# Patient Record
Sex: Male | Born: 1935 | Race: White | Hispanic: No | Marital: Married | Smoking: Former smoker
Health system: Southern US, Community
[De-identification: ages and names within clinical notes are randomized; demographics above are authoritative.]

## PROBLEM LIST (undated history)

## (undated) DIAGNOSIS — I639 Cerebral infarction, unspecified: Secondary | ICD-10-CM

## (undated) DIAGNOSIS — E785 Hyperlipidemia, unspecified: Secondary | ICD-10-CM

## (undated) DIAGNOSIS — R42 Dizziness and giddiness: Secondary | ICD-10-CM

## (undated) DIAGNOSIS — F0390 Unspecified dementia without behavioral disturbance: Secondary | ICD-10-CM

## (undated) DIAGNOSIS — I259 Chronic ischemic heart disease, unspecified: Secondary | ICD-10-CM

## (undated) DIAGNOSIS — I493 Ventricular premature depolarization: Secondary | ICD-10-CM

## (undated) DIAGNOSIS — I509 Heart failure, unspecified: Secondary | ICD-10-CM

## (undated) DIAGNOSIS — R001 Bradycardia, unspecified: Secondary | ICD-10-CM

## (undated) DIAGNOSIS — R1013 Epigastric pain: Secondary | ICD-10-CM

## (undated) DIAGNOSIS — I252 Old myocardial infarction: Secondary | ICD-10-CM

## (undated) DIAGNOSIS — I440 Atrioventricular block, first degree: Secondary | ICD-10-CM

## (undated) HISTORY — DX: Hyperlipidemia, unspecified: E78.5

## (undated) HISTORY — DX: Epigastric pain: R10.13

## (undated) HISTORY — DX: Cerebral infarction, unspecified: I63.9

## (undated) HISTORY — DX: Ventricular premature depolarization: I49.3

## (undated) HISTORY — DX: Chronic ischemic heart disease, unspecified: I25.9

## (undated) HISTORY — PX: VASECTOMY: SHX75

## (undated) HISTORY — DX: Unspecified dementia, unspecified severity, without behavioral disturbance, psychotic disturbance, mood disturbance, and anxiety: F03.90

## (undated) HISTORY — DX: Heart failure, unspecified: I50.9

## (undated) HISTORY — DX: Old myocardial infarction: I25.2

## (undated) HISTORY — DX: Dizziness and giddiness: R42

## (undated) HISTORY — DX: Bradycardia, unspecified: R00.1

## (undated) HISTORY — DX: Atrioventricular block, first degree: I44.0

---

## 1997-10-08 ENCOUNTER — Inpatient Hospital Stay (HOSPITAL_COMMUNITY): Admission: EM | Admit: 1997-10-08 | Discharge: 1997-10-09 | Payer: Self-pay | Admitting: Emergency Medicine

## 1999-12-05 ENCOUNTER — Encounter (INDEPENDENT_AMBULATORY_CARE_PROVIDER_SITE_OTHER): Payer: Self-pay

## 1999-12-05 ENCOUNTER — Ambulatory Visit (HOSPITAL_COMMUNITY): Admission: RE | Admit: 1999-12-05 | Discharge: 1999-12-05 | Payer: Self-pay | Admitting: Gastroenterology

## 2001-11-19 ENCOUNTER — Encounter: Payer: Self-pay | Admitting: Emergency Medicine

## 2001-11-19 ENCOUNTER — Inpatient Hospital Stay (HOSPITAL_COMMUNITY): Admission: EM | Admit: 2001-11-19 | Discharge: 2001-11-21 | Payer: Self-pay | Admitting: Emergency Medicine

## 2001-11-20 ENCOUNTER — Encounter: Payer: Self-pay | Admitting: Cardiology

## 2001-11-20 ENCOUNTER — Encounter: Payer: Self-pay | Admitting: Pediatrics

## 2002-11-14 ENCOUNTER — Ambulatory Visit (HOSPITAL_COMMUNITY): Admission: RE | Admit: 2002-11-14 | Discharge: 2002-11-14 | Payer: Self-pay | Admitting: Gastroenterology

## 2002-11-14 ENCOUNTER — Encounter (INDEPENDENT_AMBULATORY_CARE_PROVIDER_SITE_OTHER): Payer: Self-pay | Admitting: Specialist

## 2003-09-12 ENCOUNTER — Encounter
Admission: RE | Admit: 2003-09-12 | Discharge: 2003-09-12 | Payer: Self-pay | Admitting: Physical Medicine and Rehabilitation

## 2004-11-08 ENCOUNTER — Emergency Department (HOSPITAL_COMMUNITY): Admission: EM | Admit: 2004-11-08 | Discharge: 2004-11-08 | Payer: Self-pay | Admitting: Emergency Medicine

## 2004-11-14 ENCOUNTER — Encounter: Admission: RE | Admit: 2004-11-14 | Discharge: 2004-11-14 | Payer: Self-pay | Admitting: Urology

## 2004-11-17 ENCOUNTER — Ambulatory Visit (HOSPITAL_BASED_OUTPATIENT_CLINIC_OR_DEPARTMENT_OTHER): Admission: RE | Admit: 2004-11-17 | Discharge: 2004-11-17 | Payer: Self-pay | Admitting: Urology

## 2004-11-17 ENCOUNTER — Ambulatory Visit (HOSPITAL_COMMUNITY): Admission: RE | Admit: 2004-11-17 | Discharge: 2004-11-17 | Payer: Self-pay | Admitting: Urology

## 2004-11-17 ENCOUNTER — Encounter (INDEPENDENT_AMBULATORY_CARE_PROVIDER_SITE_OTHER): Payer: Self-pay | Admitting: *Deleted

## 2005-03-01 ENCOUNTER — Ambulatory Visit (HOSPITAL_BASED_OUTPATIENT_CLINIC_OR_DEPARTMENT_OTHER): Admission: RE | Admit: 2005-03-01 | Discharge: 2005-03-01 | Payer: Self-pay | Admitting: Urology

## 2005-03-01 ENCOUNTER — Encounter (INDEPENDENT_AMBULATORY_CARE_PROVIDER_SITE_OTHER): Payer: Self-pay | Admitting: Specialist

## 2005-03-01 ENCOUNTER — Ambulatory Visit (HOSPITAL_COMMUNITY): Admission: RE | Admit: 2005-03-01 | Discharge: 2005-03-01 | Payer: Self-pay | Admitting: Urology

## 2005-06-14 ENCOUNTER — Ambulatory Visit (HOSPITAL_BASED_OUTPATIENT_CLINIC_OR_DEPARTMENT_OTHER): Admission: RE | Admit: 2005-06-14 | Discharge: 2005-06-14 | Payer: Self-pay | Admitting: Urology

## 2005-06-14 ENCOUNTER — Encounter (INDEPENDENT_AMBULATORY_CARE_PROVIDER_SITE_OTHER): Payer: Self-pay | Admitting: Specialist

## 2006-04-09 ENCOUNTER — Encounter: Admission: RE | Admit: 2006-04-09 | Discharge: 2006-04-09 | Payer: Self-pay | Admitting: Urology

## 2006-04-11 ENCOUNTER — Ambulatory Visit (HOSPITAL_BASED_OUTPATIENT_CLINIC_OR_DEPARTMENT_OTHER): Admission: RE | Admit: 2006-04-11 | Discharge: 2006-04-11 | Payer: Self-pay | Admitting: Urology

## 2006-04-11 ENCOUNTER — Encounter (INDEPENDENT_AMBULATORY_CARE_PROVIDER_SITE_OTHER): Payer: Self-pay | Admitting: Specialist

## 2006-08-28 ENCOUNTER — Encounter (INDEPENDENT_AMBULATORY_CARE_PROVIDER_SITE_OTHER): Payer: Self-pay | Admitting: *Deleted

## 2006-08-28 ENCOUNTER — Ambulatory Visit (HOSPITAL_BASED_OUTPATIENT_CLINIC_OR_DEPARTMENT_OTHER): Admission: RE | Admit: 2006-08-28 | Discharge: 2006-08-28 | Payer: Self-pay | Admitting: Urology

## 2007-01-25 ENCOUNTER — Encounter: Admission: RE | Admit: 2007-01-25 | Discharge: 2007-01-25 | Payer: Self-pay | Admitting: Cardiology

## 2007-04-15 ENCOUNTER — Encounter: Admission: RE | Admit: 2007-04-15 | Discharge: 2007-04-15 | Payer: Self-pay | Admitting: Urology

## 2007-04-17 ENCOUNTER — Encounter (INDEPENDENT_AMBULATORY_CARE_PROVIDER_SITE_OTHER): Payer: Self-pay | Admitting: Urology

## 2007-04-17 ENCOUNTER — Ambulatory Visit (HOSPITAL_BASED_OUTPATIENT_CLINIC_OR_DEPARTMENT_OTHER): Admission: RE | Admit: 2007-04-17 | Discharge: 2007-04-17 | Payer: Self-pay | Admitting: Urology

## 2008-06-29 ENCOUNTER — Inpatient Hospital Stay (HOSPITAL_COMMUNITY): Admission: AD | Admit: 2008-06-29 | Discharge: 2008-07-03 | Payer: Self-pay | Admitting: Cardiology

## 2008-12-09 IMAGING — CT CT HEAD W/O CM
1 series · 16 of 28 positions shown, 20 images · IV contrast (agent unspecified)
Comparison: none

CLINICAL DATA: Mental status changes.  Confusion.   History of strokes.  
 HEAD CT WITHOUT CONTRAST:
TECHNIQUE: Contiguous axial images were obtained from the base of the skull through the vertex according to standard protocol without contrast.

[Series 2: head · axial · 0.49mm/px · z∈[+14,+149]mm · 16 of 28 slices shown, 20 images]
[im 2/28  brain]
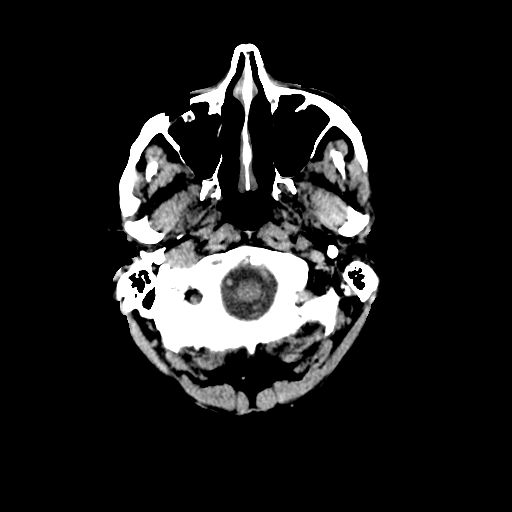
[im 2/28  bone]
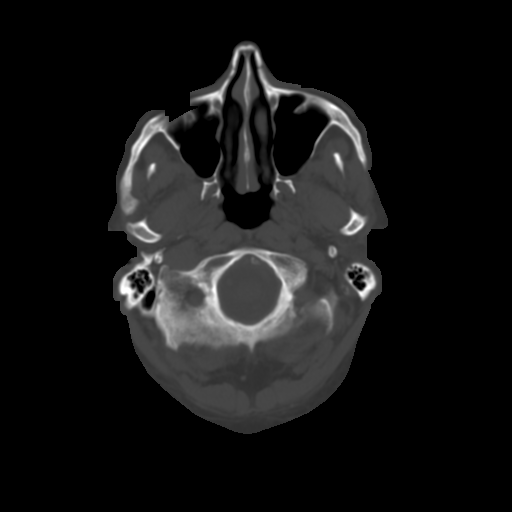
[im 4/28  brain]
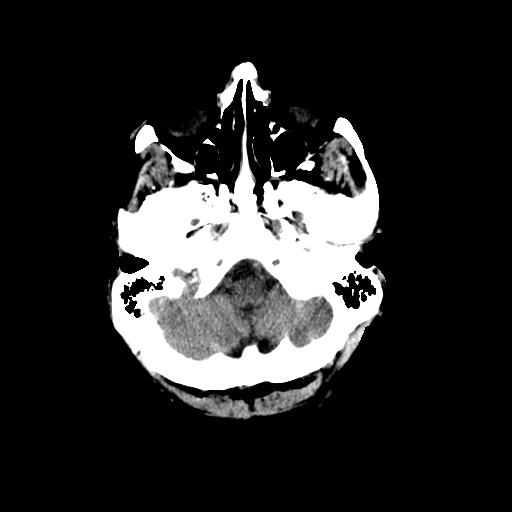
[im 6/28  brain]
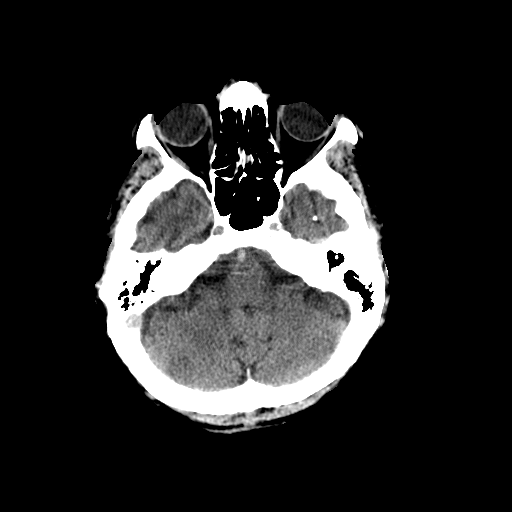
[im 7/28  brain]
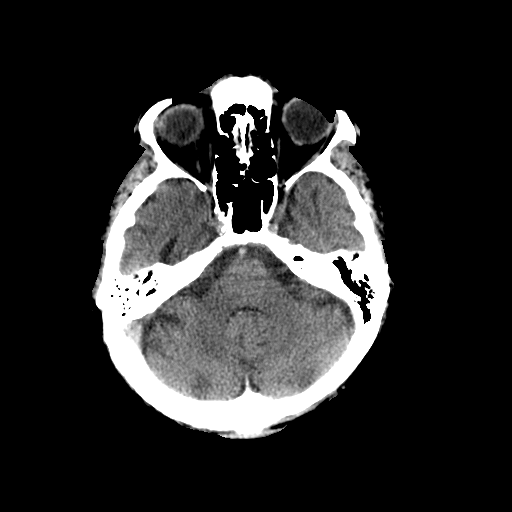
[im 9/28  brain]
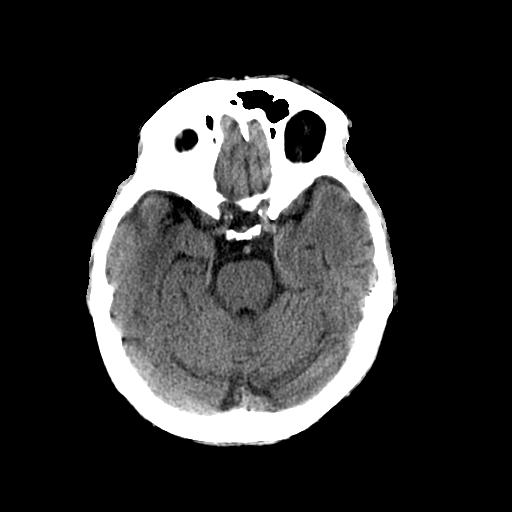
[im 9/28  bone]
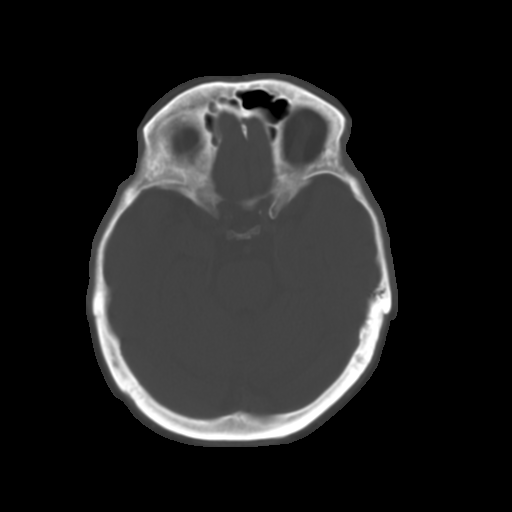
[im 10/28  brain]
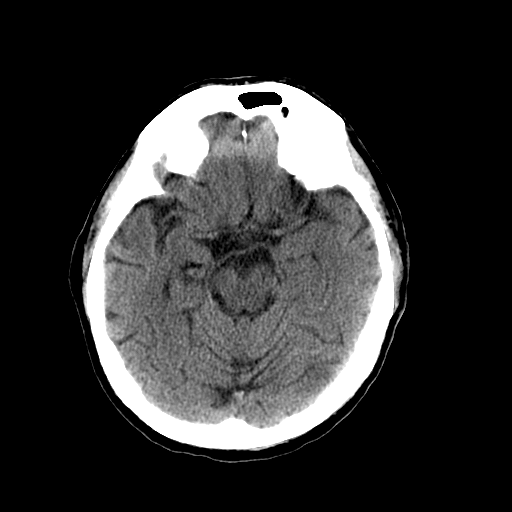
[im 12/28  brain]
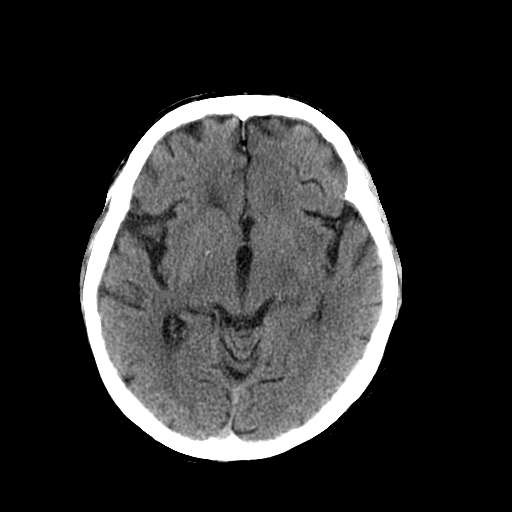
[im 14/28  brain]
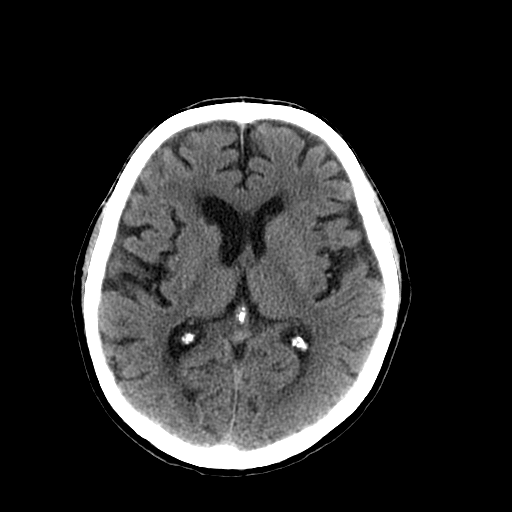
[im 15/28  brain]
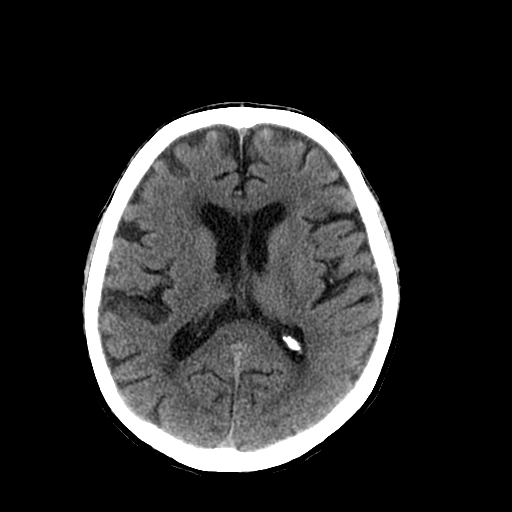
[im 15/28  bone]
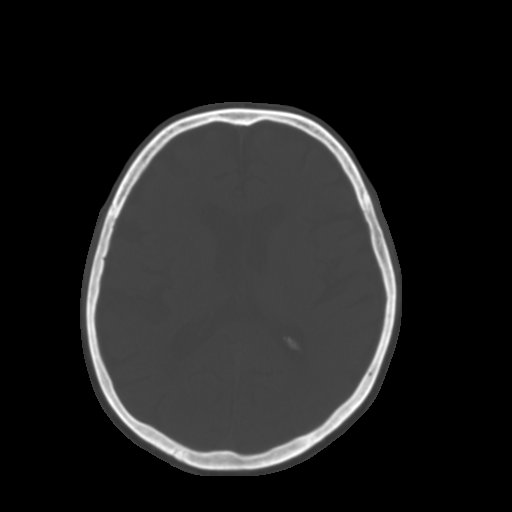
[im 17/28  brain]
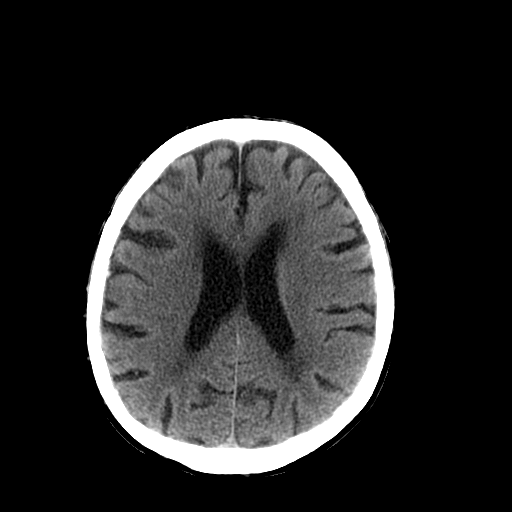
[im 19/28  brain]
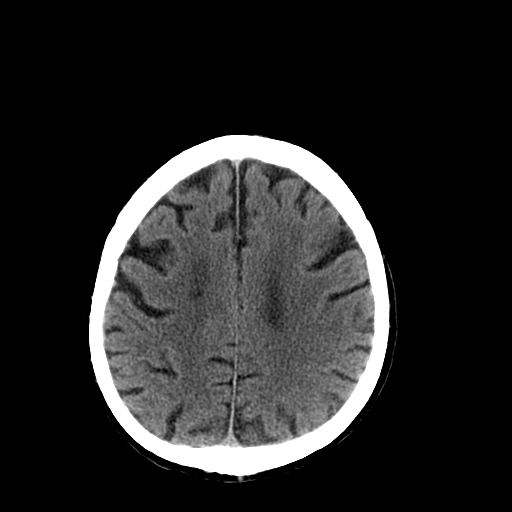
[im 20/28  brain]
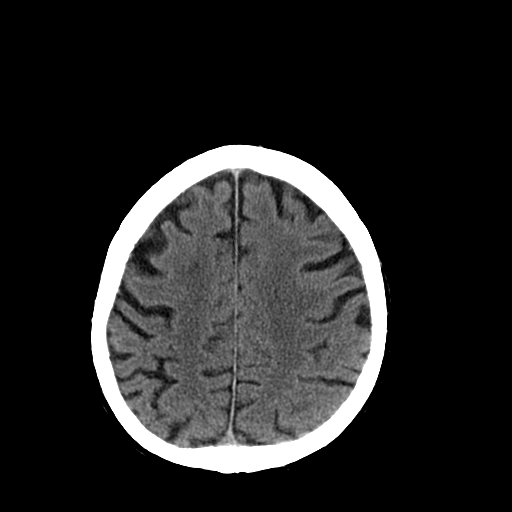
[im 22/28  brain]
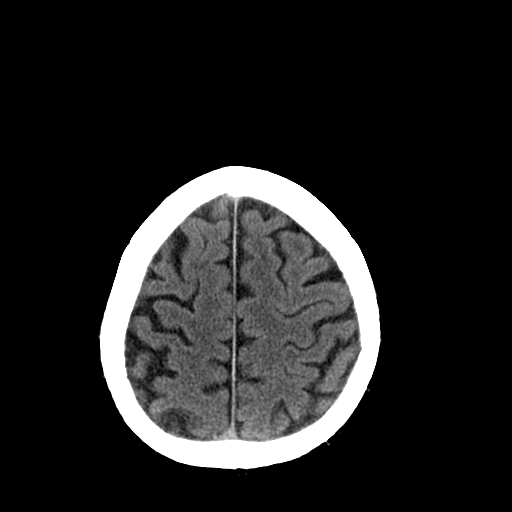
[im 22/28  bone]
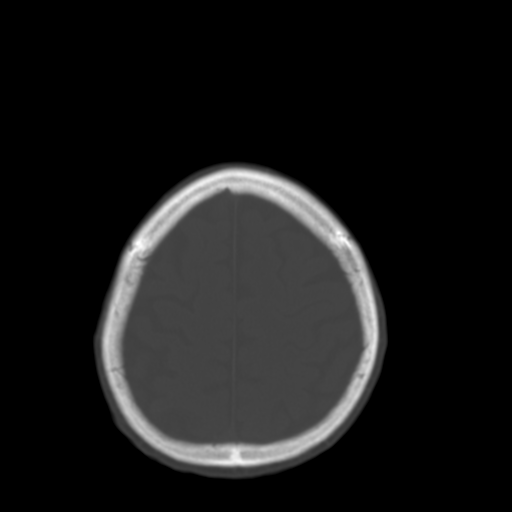
[im 23/28  brain]
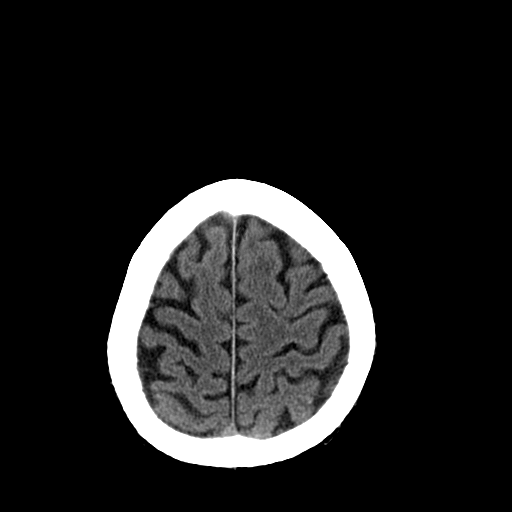
[im 25/28  brain]
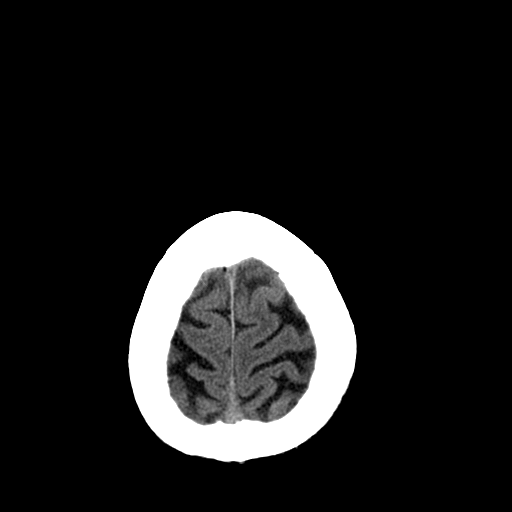
[im 27/28  brain]
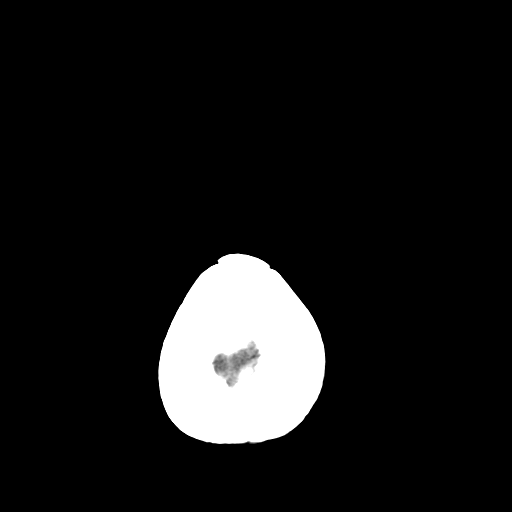

[16 of 28 positions shown; findings below may reference images not displayed]

FINDINGS: Age appropriate moderate generalized cerebral atrophic changes.  Mild cerebellar atrophy.  Findings compatible with chronic small vessel disease of the cerebral deep periventricular white matter.  Negative for acute infarction, hemorrhage, mass, or hydronephrosis.
IMPRESSION: Atrophic changes and chronic small vessel disease of the cerebral white matter.

## 2010-04-01 ENCOUNTER — Ambulatory Visit: Payer: Self-pay | Admitting: Cardiology

## 2010-05-14 IMAGING — CR DG CHEST 1V PORT
1 series · 1 of 1 positions shown · non-contrast
Comparison: Portable exam 4314 hours compared to 04/15/2007

CLINICAL DATA: CHF, weakness, hypotension, history coronary artery
disease status post bypass, former smoker

PORTABLE CHEST - 1 VIEW

[view not recorded]
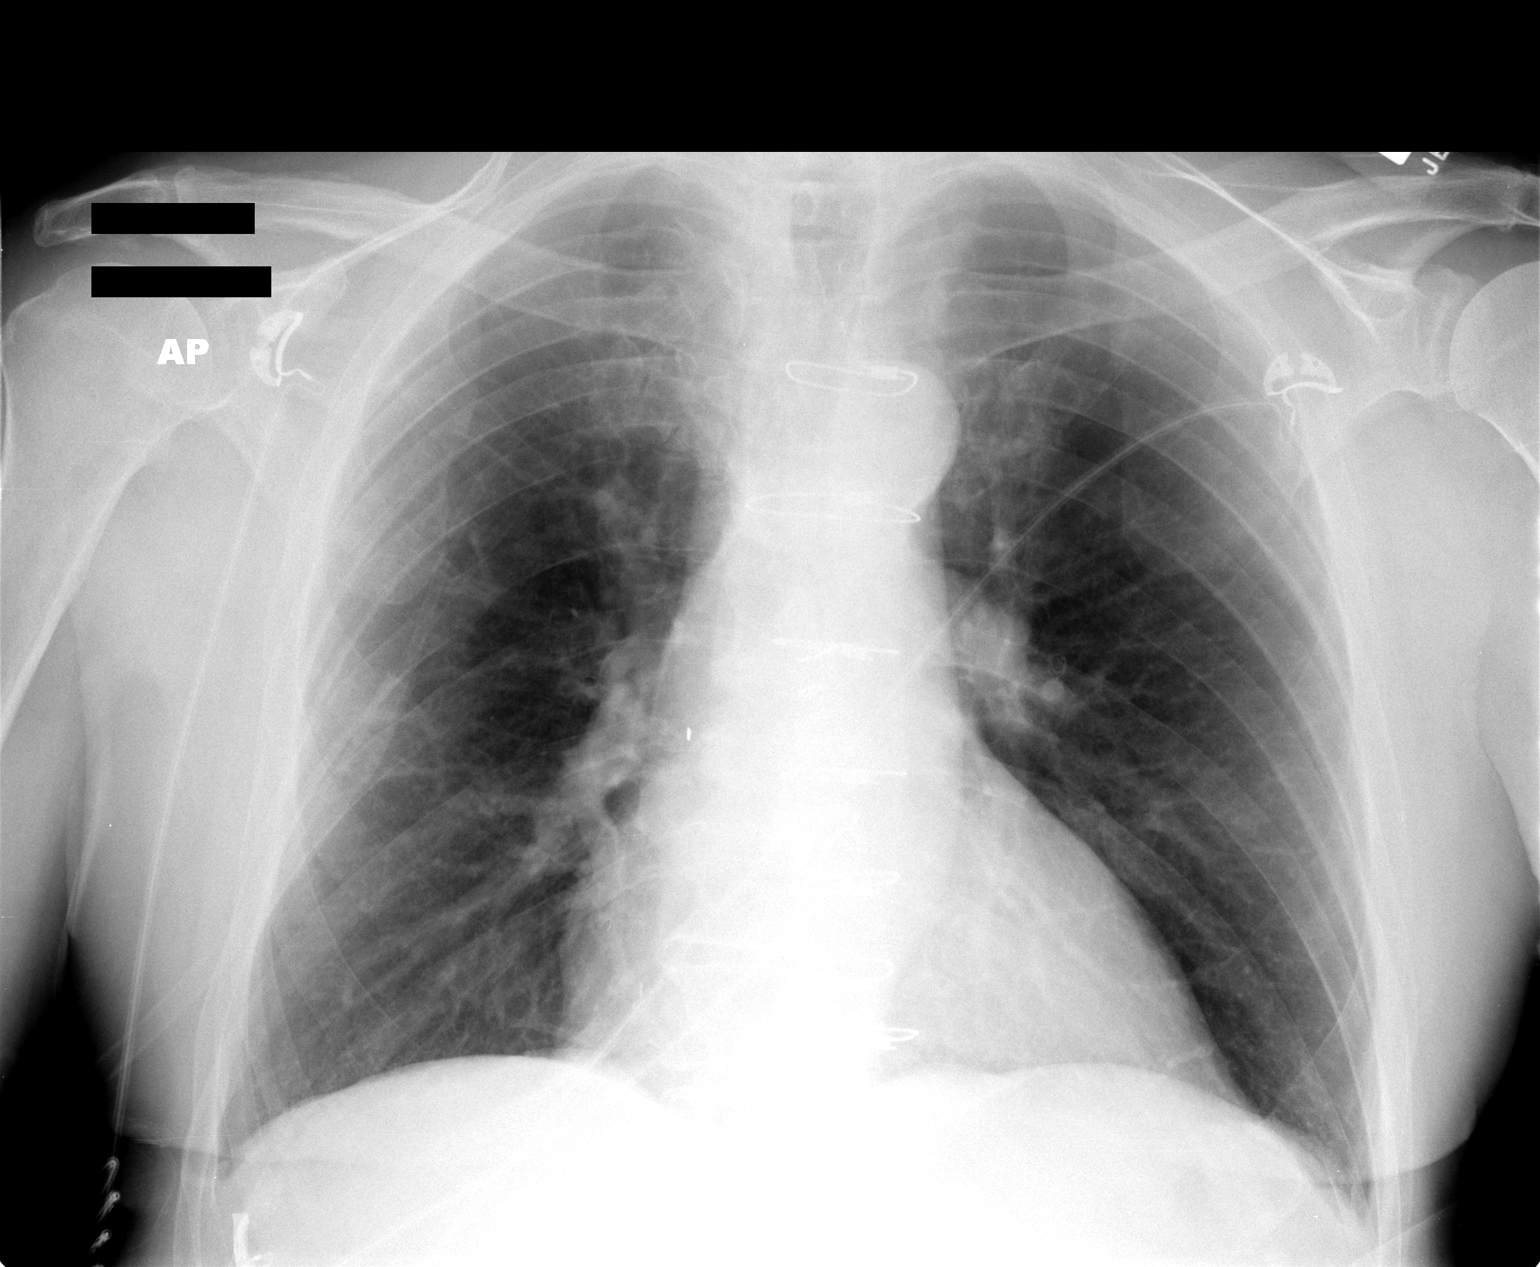

[1 of 1 positions shown; findings below may reference images not displayed]

FINDINGS: Mild cardiac enlargement status post CABG.
Tortuous thoracic aorta.
Pulmonary vascularity normal.
No acute infiltrate or effusion.
Bony demineralization.
IMPRESSION: Mild cardiomegaly status post CABG.
No acute abnormalities.

## 2010-05-15 IMAGING — US US RENAL PORT
1 series · 14 of 25 positions shown · non-contrast
Comparison: MRI lumbar spine 09/12/2003.

CLINICAL DATA: 83-year-old male with acute renal failure and
dehydration.

RENAL/URINARY TRACT ULTRASOUND
TECHNIQUE: Complete ultrasound examination of the urinary tract
was performed including evaluation of the kidneys, renal collecting
systems, and urinary bladder.

[Series 1: us renal port · 0.24mm/px · 30 acquisitions, 14 frames shown]
[im 1/30]
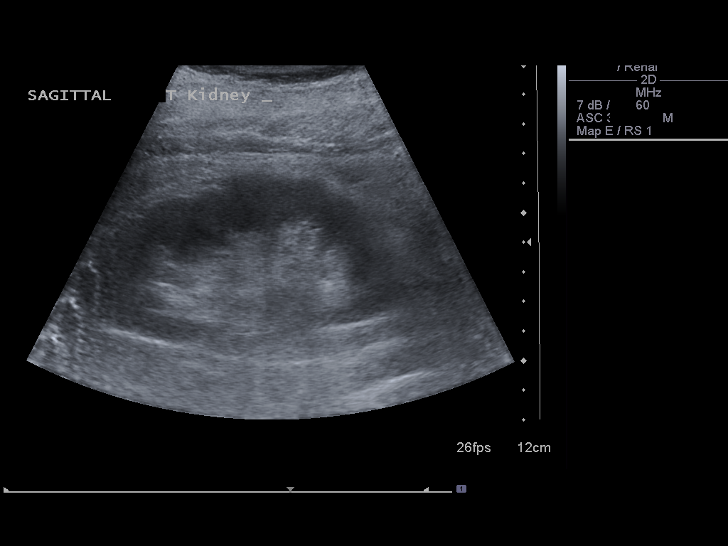
[im 3/30]
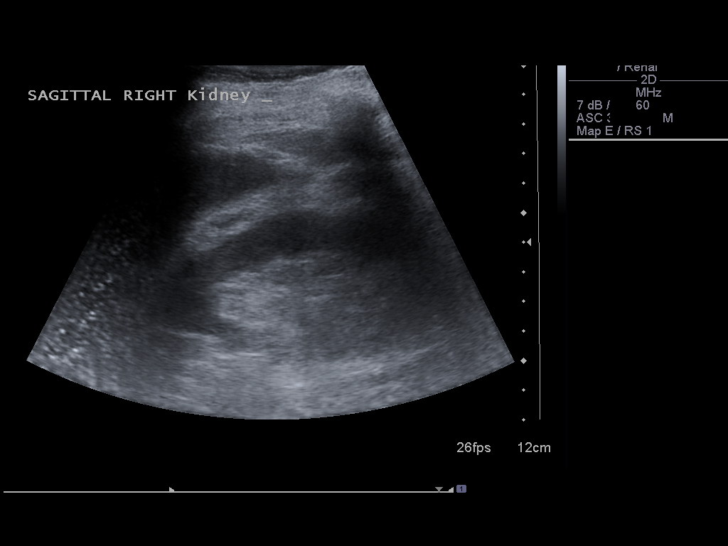
[im 5/30]
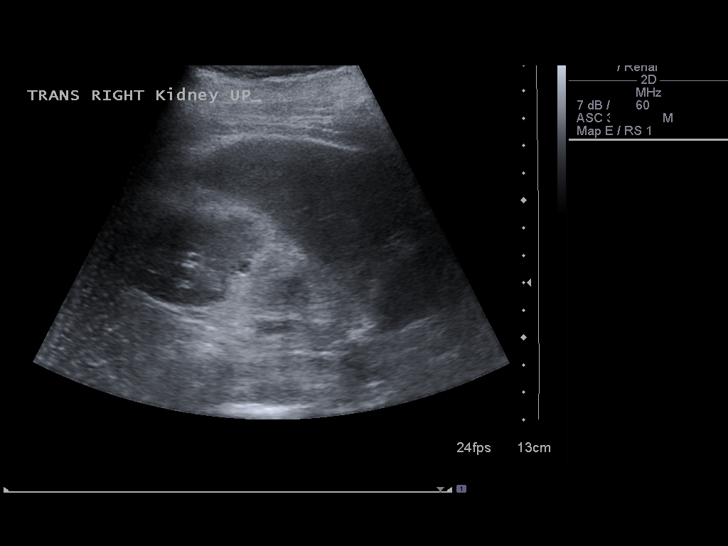
[im 8/30]
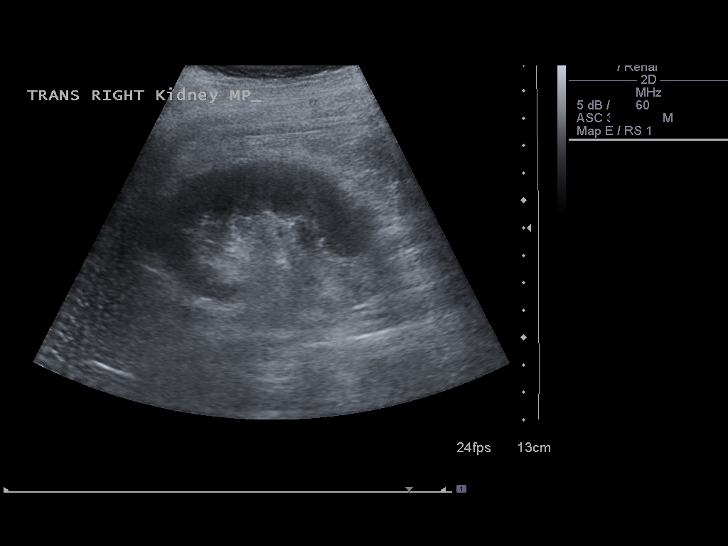
[im 10/30]
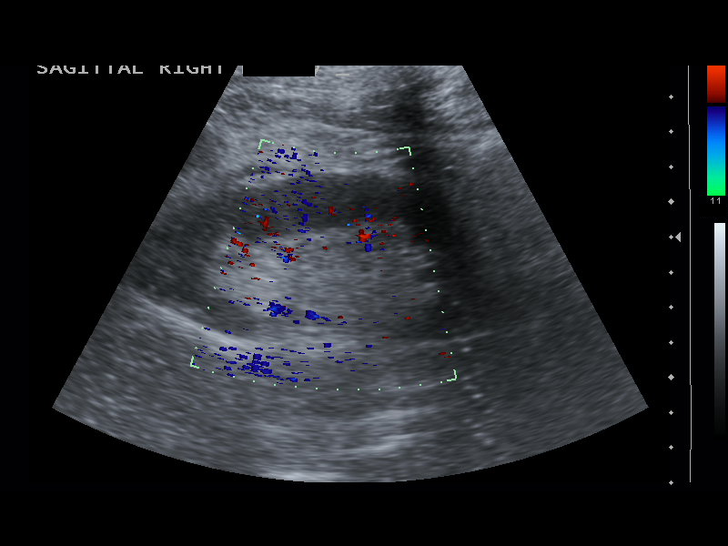
[im 11/30]
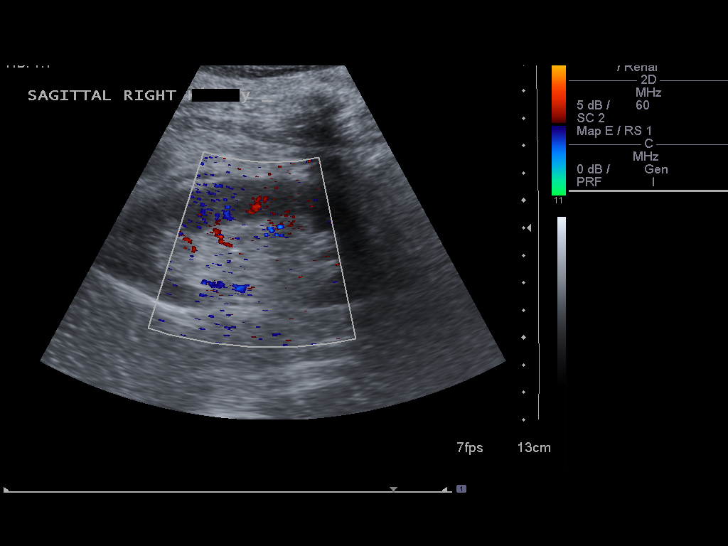
[im 14/30]
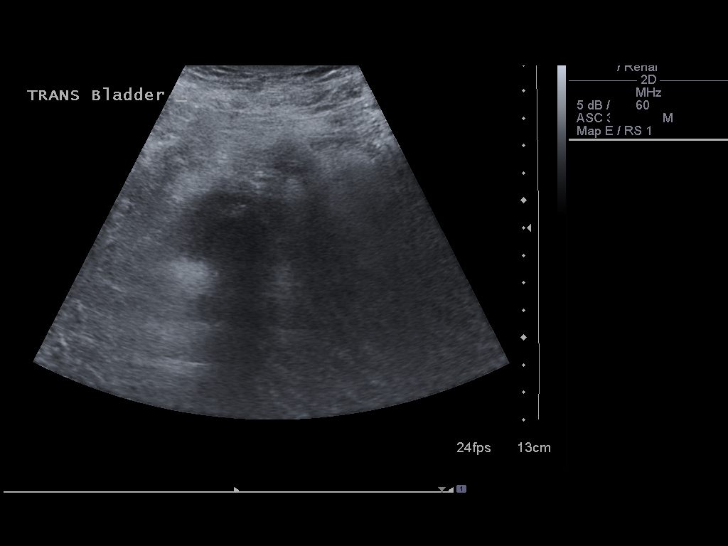
[im 16/30]
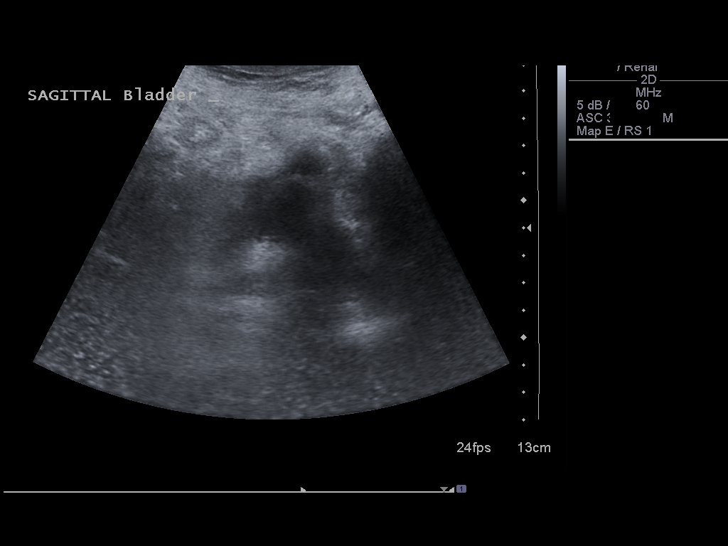
[im 19/30]
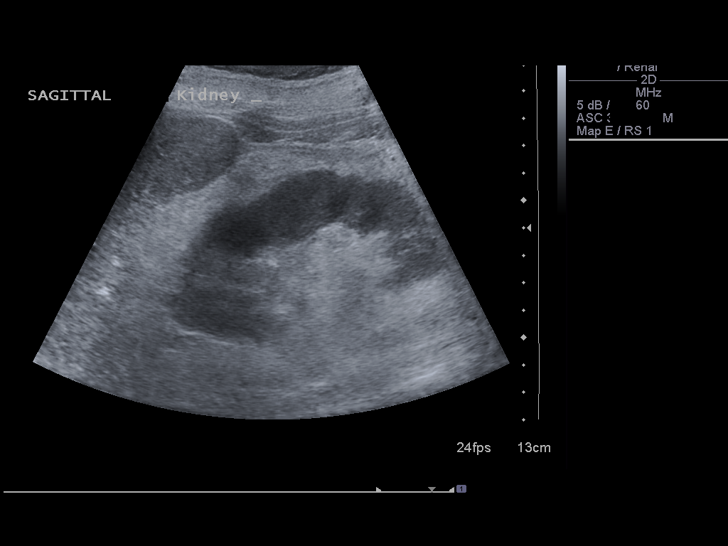
[im 20/30]
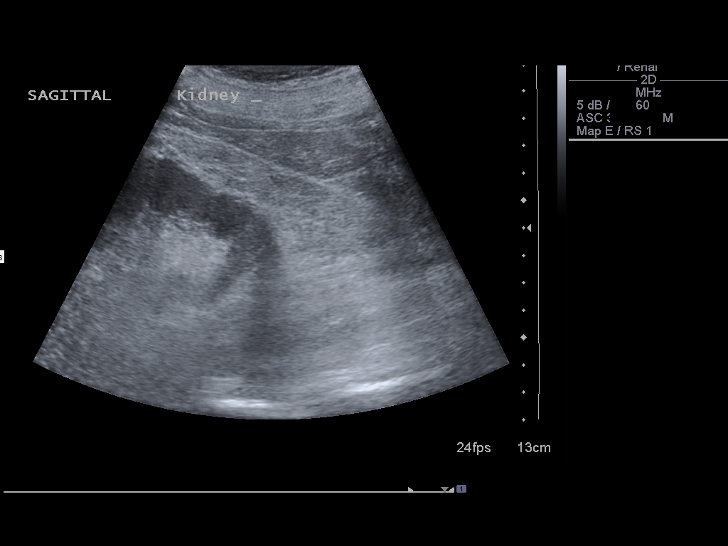
[im 22/30]
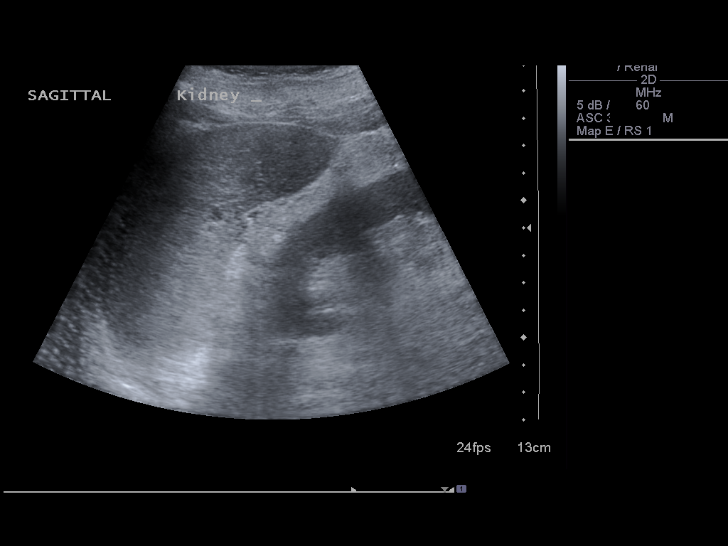
[im 25/30]
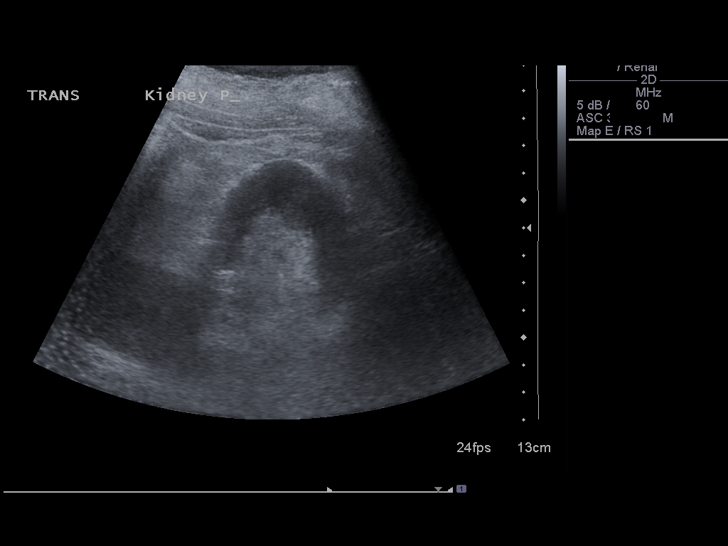
[im 27/30]
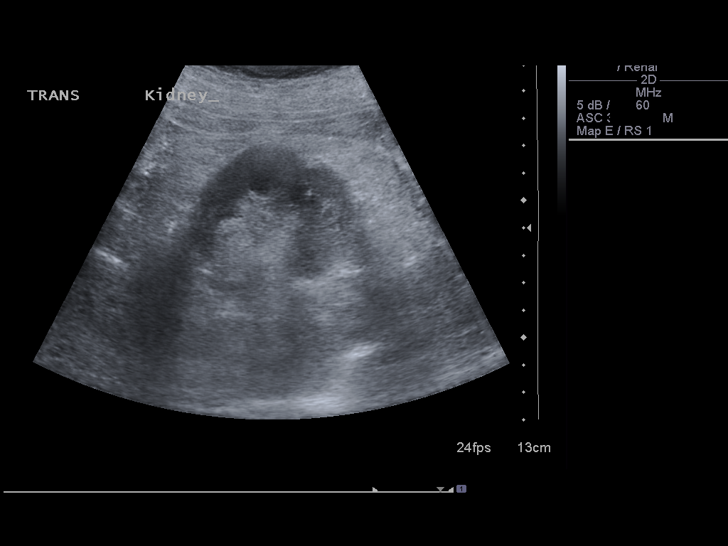
[im 30/30]
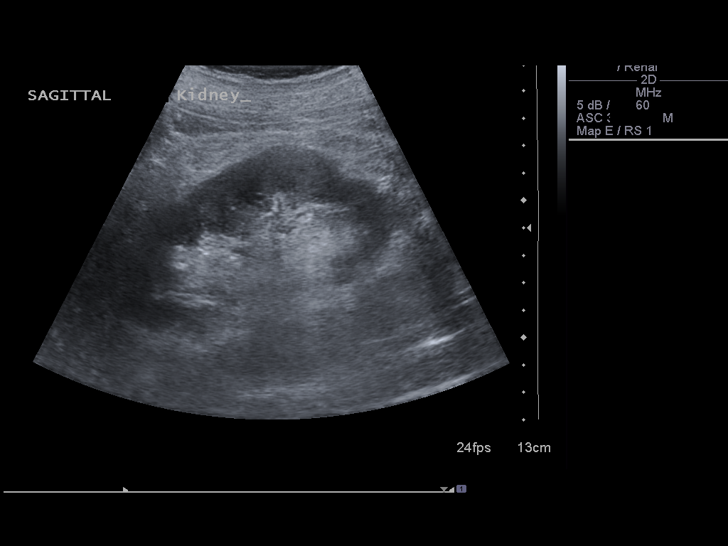

[14 of 25 positions shown; findings below may reference images not displayed]

FINDINGS: The right kidney is not obstructed measuring 10.5 cm in
length.  Right renal cortical thickness is within normal limits for
age.  No right renal lesion identified.

The bladder is not visualized, the Foley catheter is in place.

The left kidney is not obstructed measuring 10.9 cm in length.
Left renal cortical thickness and corticomedullary differentiation
are within normal limits.  No left renal lesion identified.
IMPRESSION: Normal kidneys for age.  Bladder is decompressed.

## 2010-07-03 ENCOUNTER — Encounter: Payer: Self-pay | Admitting: Gastroenterology

## 2010-08-09 ENCOUNTER — Ambulatory Visit (INDEPENDENT_AMBULATORY_CARE_PROVIDER_SITE_OTHER): Payer: Medicare Other | Admitting: Cardiology

## 2010-08-09 DIAGNOSIS — Z79899 Other long term (current) drug therapy: Secondary | ICD-10-CM

## 2010-08-09 DIAGNOSIS — E7800 Pure hypercholesterolemia, unspecified: Secondary | ICD-10-CM

## 2010-08-09 DIAGNOSIS — R0602 Shortness of breath: Secondary | ICD-10-CM

## 2010-08-10 ENCOUNTER — Ambulatory Visit: Payer: Self-pay | Admitting: Cardiology

## 2010-09-26 LAB — BASIC METABOLIC PANEL
BUN: 143 mg/dL — ABNORMAL HIGH (ref 6–23)
BUN: 21 mg/dL (ref 6–23)
CO2: 15 mEq/L — ABNORMAL LOW (ref 19–32)
Calcium: 8.6 mg/dL (ref 8.4–10.5)
Chloride: 113 mEq/L — ABNORMAL HIGH (ref 96–112)
Chloride: 115 mEq/L — ABNORMAL HIGH (ref 96–112)
Chloride: 115 mEq/L — ABNORMAL HIGH (ref 96–112)
Creatinine, Ser: 2.06 mg/dL — ABNORMAL HIGH (ref 0.4–1.5)
GFR calc Af Amer: 38 mL/min — ABNORMAL LOW (ref >60)
GFR calc Af Amer: 9 mL/min — ABNORMAL LOW (ref >60)
GFR calc non Af Amer: 7 mL/min — ABNORMAL LOW (ref >60)
GFR calc non Af Amer: >60 mL/min (ref >60)
Glucose, Bld: 135 mg/dL — ABNORMAL HIGH (ref 70–99)
Potassium: 4.4 mEq/L (ref 3.5–5.1)
Potassium: >7.5 mEq/L (ref 3.5–5.1)
Potassium: >7.5 mEq/L (ref 3.5–5.1)
Sodium: 137 mEq/L (ref 135–145)
Sodium: 142 mEq/L (ref 135–145)
Sodium: 142 mEq/L (ref 135–145)

## 2010-09-26 LAB — COMPREHENSIVE METABOLIC PANEL
ALT: 27 U/L (ref 0–53)
ALT: 57 U/L — ABNORMAL HIGH (ref 0–53)
AST: 21 U/L (ref 0–37)
AST: 27 U/L (ref 0–37)
Albumin: 3.9 g/dL (ref 3.5–5.2)
Alkaline Phosphatase: 67 U/L (ref 39–117)
BUN: 145 mg/dL — ABNORMAL HIGH (ref 6–23)
CO2: 20 mEq/L (ref 19–32)
Chloride: 113 mEq/L — ABNORMAL HIGH (ref 96–112)
Chloride: 119 mEq/L — ABNORMAL HIGH (ref 96–112)
Creatinine, Ser: 1.16 mg/dL (ref 0.4–1.5)
GFR calc Af Amer: >60 mL/min (ref >60)
GFR calc non Af Amer: >60 mL/min (ref >60)
Potassium: >7.5 mEq/L (ref 3.5–5.1)
Sodium: 134 mEq/L — ABNORMAL LOW (ref 135–145)
Sodium: 143 mEq/L (ref 135–145)
Total Bilirubin: 0.6 mg/dL (ref 0.3–1.2)
Total Bilirubin: 1.1 mg/dL (ref 0.3–1.2)
Total Protein: 7.8 g/dL (ref 6.0–8.3)

## 2010-09-26 LAB — URINALYSIS, ROUTINE W REFLEX MICROSCOPIC
Bilirubin Urine: NEGATIVE
Glucose, UA: NEGATIVE mg/dL
Glucose, UA: NEGATIVE mg/dL
Ketones, ur: NEGATIVE mg/dL
Leukocytes, UA: NEGATIVE
pH: 5.5 (ref 5.0–8.0)
pH: 5.5 (ref 5.0–8.0)

## 2010-09-26 LAB — URINE CULTURE
Colony Count: NO GROWTH
Culture: NO GROWTH

## 2010-09-26 LAB — GLUCOSE, CAPILLARY: Glucose-Capillary: 136 mg/dL — ABNORMAL HIGH (ref 70–99)

## 2010-09-26 LAB — POCT I-STAT EG7
Acid-base deficit: 8 mmol/L — ABNORMAL HIGH (ref 0.0–2.0)
Bicarbonate: 17.3 mEq/L — ABNORMAL LOW (ref 20.0–24.0)
HCT: 37 % — ABNORMAL LOW (ref 39.0–52.0)
O2 Saturation: 69 %
TCO2: 18 mmol/L (ref 0–100)
pO2, Ven: 39 mmHg (ref 30.0–45.0)

## 2010-09-26 LAB — POCT I-STAT 3, ART BLOOD GAS (G3+)
Acid-base deficit: 11 mmol/L — ABNORMAL HIGH (ref 0.0–2.0)
Bicarbonate: 13.8 mEq/L — ABNORMAL LOW (ref 20.0–24.0)
O2 Saturation: 94 %
Patient temperature: 97.7
TCO2: 15 mmol/L (ref 0–100)
pO2, Arterial: 73 mmHg — ABNORMAL LOW (ref 80.0–100.0)

## 2010-09-26 LAB — CBC
HCT: 30.8 % — ABNORMAL LOW (ref 39.0–52.0)
Hemoglobin: 14 g/dL (ref 13.0–17.0)
MCHC: 33.5 g/dL (ref 30.0–36.0)
MCV: 93.2 fL (ref 78.0–100.0)
MCV: 94.7 fL (ref 78.0–100.0)
Platelets: 96 10*3/uL — ABNORMAL LOW (ref 150–400)
RBC: 3.08 MIL/uL — ABNORMAL LOW (ref 4.22–5.81)
RBC: 4.4 MIL/uL (ref 4.22–5.81)
WBC: 5.7 10*3/uL (ref 4.0–10.5)
WBC: 6.8 10*3/uL (ref 4.0–10.5)

## 2010-09-26 LAB — URINE MICROSCOPIC-ADD ON

## 2010-09-26 LAB — FOLATE: Folate: 7.9 ng/mL

## 2010-09-26 LAB — LIPID PANEL
LDL Cholesterol: 60 mg/dL (ref 0–99)
Triglycerides: 102 mg/dL (ref <150)
VLDL: 20 mg/dL (ref 0–40)

## 2010-09-26 LAB — RENAL FUNCTION PANEL
CO2: 15 mEq/L — ABNORMAL LOW (ref 19–32)
Calcium: 8.6 mg/dL (ref 8.4–10.5)
Chloride: 111 mEq/L (ref 96–112)
GFR calc Af Amer: 15 mL/min — ABNORMAL LOW (ref >60)
GFR calc non Af Amer: 13 mL/min — ABNORMAL LOW (ref >60)
Potassium: 4.3 mEq/L (ref 3.5–5.1)
Sodium: 141 mEq/L (ref 135–145)

## 2010-09-26 LAB — URIC ACID: Uric Acid, Serum: 4.7 mg/dL (ref 4.0–7.8)

## 2010-09-26 LAB — FERRITIN: Ferritin: 553 ng/mL — ABNORMAL HIGH (ref 22–322)

## 2010-09-26 LAB — T4: T4, Total: 3.7 ug/dL — ABNORMAL LOW (ref 5.0–12.5)

## 2010-09-26 LAB — TSH: TSH: 1.909 u[IU]/mL (ref 0.350–4.500)

## 2010-09-26 LAB — SODIUM, URINE, RANDOM: Sodium, Ur: 78 mEq/L

## 2010-09-26 LAB — PROTIME-INR: Prothrombin Time: 15.4 seconds — ABNORMAL HIGH (ref 11.6–15.2)

## 2010-09-26 LAB — IRON AND TIBC
Saturation Ratios: 46 % (ref 20–55)
TIBC: 182 ug/dL — ABNORMAL LOW (ref 215–435)

## 2010-09-26 LAB — CREATININE, URINE, RANDOM: Creatinine, Urine: 150.7 mg/dL

## 2010-09-30 ENCOUNTER — Other Ambulatory Visit: Payer: Self-pay | Admitting: *Deleted

## 2010-09-30 DIAGNOSIS — F0390 Unspecified dementia without behavioral disturbance: Secondary | ICD-10-CM

## 2010-09-30 MED ORDER — MEMANTINE HCL 10 MG PO TABS: 10.0000 mg | ORAL_TABLET | Freq: Two times a day (BID) | ORAL | Status: DC

## 2010-09-30 NOTE — Telephone Encounter (Signed)
Refilled meds per fax request.  

## 2010-10-25 NOTE — Consult Note (Signed)
[name], [name]               ACCOUNT NO.:  [id number]   MEDICAL RECORD NO.:  [id number]          PATIENT TYPE:  INP   LOCATION:  2112                         FACILITY:  [facility]   PHYSICIAN:  Dyke Maes, M.D.DATE OF BIRTH:  28-Nov-1924   DATE OF CONSULTATION:  06/30/2008  DATE OF DISCHARGE:                                 CONSULTATION   REFERRING PHYSICIAN:  Cassell Clement, MD   REASON FOR CONSULTATION:  Acute renal failure and hyperkalemia.   HISTORY OF PRESENT ILLNESS:  This is an 77 year old white male whose  wife apparently called Dr. Patty Sermons earlier today saying that he was  extremely weak over the last few days with increased somnolence and  decreased p.o. intake.  He was made a direct admission to the hospital.  Laboratory done on arrival to the hospital showed potassium greater than  7.5 and a creatinine level of 8.4 with BUN of 145.  The patient has  underlying dementia and it is difficult to get a good history from him.  Reportedly, there is no known history of renal disease.  He was treated  with bicarb, Kayexalate, calcium, and followup potassium still revealed  a level greater than 7.5.  He had been on spironolactone, potassium  chloride, and lisinopril as well as ibuprofen on a daily basis prior to  admission.  Also on arrival to the hospital, he had systolic blood  pressure in the 60s, which did improve with IV fluids.  The only serum  creatinine I have is from April 19, 2008, at which time it was 1.1.   PAST MEDICAL HISTORY:  From old age, he and the computers suggest he has  coronary artery disease, status post MI in 10, CABG in 1985, history  of hypertension, and history of gout.   ALLERGIES:  None.   MEDICATIONS PRIOR TO ADMISSION:  1. Potassium chloride 20 mEq a day.  2. Lasix 40 mg a day.  3. Carvedilol 6.25 mg b.i.d.  4. Aricept 10 mg a day.  5. Plavix 75 mg a day.  6. Aspirin 325 mg a day.  7. Namenda 10 mg a day.  8. Allopurinol  100 b.i.d.  9. Lisinopril 20 mg a day.  10.Zocor 20 mg a day.  11.Ibuprofen 200 mg a day.  12.Spironolactone 25 mg a day.   SOCIAL HISTORY:  He is an ex-smoker, quit in 1985.  He is married,  retired, lives with his wife.   FAMILY HISTORY:  Negative for renal disease.   REVIEW OF SYSTEMS:  Difficult to obtain.  Previous dementia, but he does  complain of nerves.  He denies any shortness of breath or chest pains.  There is no abdominal pain.  No new arthritic complaints.  Rest of  review of systems is unremarkable as best as I can tell.   PHYSICAL EXAMINATION:  VITAL SIGNS:  Blood pressure 108/51, pulse 69,  and temperature 97.7.  GENERAL:  An elderly 77 year old white male with mild distress.  HEENT:  Sclera nonicteric.  Extraocular muscles are intact.  NECK:  No JVD.  LUNGS:  Clear to auscultation.  HEART:  Regular rate and rhythm.  No murmur, rub, or gallop.  SKIN:  Decreased skin turgor.  ABDOMEN:  Positive bowel sounds, nontender, and nondistended.  No  hepatosplenomegaly.  EXTREMITIES:  No clubbing, cyanosis, or edema.  NEUROLOGIC:  He is awake, alert, and oriented x2.  He moves all his  extremities spontaneously.   LABORATORY DATA:  Sodium 136, potassium greater than 7.5, bicarb 15, BUN  139, and creatinine 6.8.  Currently these are repeat labs.  WBC 16.9,  hemoglobin 14.0, platelet count 159,000.   IMPRESSION:  1. Presumably acute renal failure secondary to volume depletion, on      ACE inhibitor and nonsteroidal medications.  2. Hyperkalemia secondary to acute renal failure, potassium      supplementation, lisinopril, and spirolactone.  3. History of coronary artery disease with cardiomyopathy with an      ejection fraction of 35%.   PLAN:  1. Emergent hemodialysis to lower his potassium.  Continue with the IV      fluids.  We will hold his potassium chloride, lisinopril,      spirolactone, and Lasix.  2. Check urinalysis, urine sodium, and urine creatinine.  3.  Check renal ultrasound.  4. Daily serum creatinine.   Thank you very much for the consult.  I will follow the patient with  you.           ______________________________  Dyke Maes, M.D.     MTM/MEDQ  D:  06/30/2008  T:  06/30/2008  Job:  [id number]

## 2010-10-25 NOTE — Discharge Summary (Signed)
NAMEEDD, REPPERT               ACCOUNT NO.:  [id number]   MEDICAL RECORD NO.:  [id number]          PATIENT TYPE:  INP   LOCATION:  3712                         FACILITY:  [facility]   PHYSICIAN:  Cassell Clement, M.D. DATE OF BIRTH:  1924/08/26   DATE OF ADMISSION:  06/29/2008  DATE OF DISCHARGE:  07/03/2008                               DISCHARGE SUMMARY   FINAL DIAGNOSES:  1. Acute renal failure, secondary to dehydration, resolved.  2. Ischemic cardiomyopathy.  3. Marked symptomatic bradycardia, secondary to carvedilol.  4. Dementia.  5. Old cerebrovascular accident.  6. Hyperkalemia.  7. Hyperuricemia.   OPERATIONS PERFORMED:  Emergency hemodialysis by Dr. Briant Cedar.   HISTORY:  This 77 year old married Caucasian gentleman was admitted  directly from home to the hospital.  His wife had called the office  stating that the patient was too weak to sit up in bed.  He has a  history of known coronary artery disease with myocardial infarction in  the inferior wall in 1977, and a second MI in 1985, following which he  underwent coronary artery bypass graft surgery.  He has had no  subsequent problems with any angina pectoris.  In 2005, a nuclear stress  test showed evidence of a large inferior wall MI, but no reversible  ischemia and his ejection fraction was 42%.  The patient developed  congestive heart failure in autumn of 2009, with weight gain of 295  pounds.  An echocardiogram that day on April 22, 2008, showed an  ejection fraction of 35-40% with combined systolic and diastolic  dysfunction and he was begun on Lasix.  On a subsequent visit, the  patient was also placed on spironolactone.  He had already been on ACE  inhibitors.  Carvedilol was also added to his regimen.  When we last saw  him in the office on May 26, 2008, his weight was down to 171 and  he was feeling better.  However, in the last several days, he has become  progressively weak, has stopped eating and  drinking, and has not gotten  out of bed.   PHYSICAL EXAMINATION:  VITAL SIGNS:  On arrival to his room on 2000  telemetry; his blood pressure was only 65/40, pulse was 48 with marked  sinus bradycardia, and O2 sat was 97%.  GENERAL:  The patient was alert and lethargic, but would answer  questions.  SKIN:  Very dry.  HEENT:  The pupils were equal and reactive.  The sclerae were nonicteric  and the carotids had a normal upstroke.  LUNGS:  Clear to auscultation.  HEART:  Distant heart tones without murmur, gallop, rub, or click.  ABDOMEN:  Soft and nontender.  EXTREMITIES:  No phlebitis or edema.   HOSPITAL COURSE:  The patient became more unstable while on 2000.  Heart  rate dipped down into the 30s and he had to receive 0.5 of atropine IV.  Venous access was initially a problem and admission labs were not  obtained until later in the evening, they returned with critical values.  His potassium on admission was greater than 7.5 and was repeated  at 7.5.  His creatinine came back at 8.0.  There was no urine in the bladder and  he was in metabolic acidosis.  He was initially given treatment for  hyperkalemia with Kayexalate, IV sodium bicarbonate, and D5W with  insulin.  Renal consultant was asked to see the patient on the night of  admission by the covering cardiologist and placed a right femoral  hemodialysis catheter, and the patient proceeded to have emergency  hemodialysis for his hyperkalemia.   Review of the office records revealed that when last seen on May 26, 2008, his BUN had been 37 and creatinine 1.42, and potassium 4.3.   By July 01, 2008, without further hemodialysis, his creatinine was  down to 2.06, BUN down to 64, potassium 4.2, and sodium 142.  He did  have some problems with low blood pressure on June 30, 2008,  requiring renal dose dopamine.   By July 01, 2008, he was able to be transferred to telemetry.  He had  been treated with aggressive IV  saline fluid resuscitation.   By July 01, 2008, we were able to cut that IV rate down to 100 mL an  hour and remove his Foley and begin physical therapy.  The patient  continued to improve.  He was noted to be anemic.  Hemoglobin on  admission was 14.0, but probably represented hemoconcentration,  secondary to his severe state of dehydration.   On July 02, 2008, hemoglobin was noted to be 9.8.  Anemia panel was  obtained and did not show any evidence of pernicious anemia or iron-  deficiency anemia.  The stool Hemoccult was requested.  We initially  thought of restarting his carvedilol and low-dose while in the hospital,  but decided against it because of a marked prolonged PR interval of 0.3.   By July 03, 2008, the patient was alert, eating well, drinking well,  and in no distress.  He denied any chest pain.  Enzymes in the hospital  did show slight bump consistent with subendocardial ischemia, secondary  to prolonged hypoperfusion and shock, secondary to dehydration.  For  this reason, Imdur was added to his regimen at the time of discharge.   The patient is being discharged on the following regimen.  To be low-  sodium and heart-healthy diet.  He will be followed up by home health  RN, OT, and PT.  He will be seen back in our office on Tuesday, July 07, 2008, at [facility] branch, at which time, we will get an EKG, office  visit, BMET, and uric acid.   DISCHARGE MEDICINES:  1. Aricept 10 mg daily.  2. Namenda 10 mg twice a day.  3. Simvastatin 20 mg daily.  4. Ibuprofen 200 mg if needed for arthritis.  5. Aspirin 81 mg daily.  6. Plavix 75 mg daily.  7. Furosemide 40 mg 1 daily.  8. Xalatan eye drops as directed.  9. Nitrostat 1/150 sublingually p.r.n.  10.Tylenol 325 two every 6 hours p.r.n.  11.Xanax 0.25 one twice a day p.r.n. anxiety.  12.Imdur 30 mg generic 1 each morning.  13.Centrum Silver multivitamin 1 daily.   The patient is to keep a written record of  his morning weight and show  then to Dr. Patty Sermons at the next visit.  We are stopping multiple  medicines including potassium, carvedilol, spironolactone, lisinopril,  and allopurinol.   CONDITION ON DISCHARGE:  Improved.   Time spent in preparation of discharge papers, reviewing medications  with  the patient, and calling wife to discuss discharge plan, time spent  greater than 30 minutes.           ______________________________  Cassell Clement, M.D.     TB/MEDQ  D:  07/03/2008  T:  07/04/2008  Job:  [id number]   cc:   Garnetta Buddy, M.D.

## 2010-10-25 NOTE — H&P (Signed)
NAMEJASE, [name] [name]               ACCOUNT NO.:  [id number]   MEDICAL RECORD NO.:  [id number]          PATIENT TYPE:  INP   LOCATION:  2112                         FACILITY:  [facility]   PHYSICIAN:  Cassell Clement, M.D. DATE OF BIRTH:  March 18, 1925   DATE OF ADMISSION:  06/29/2008  DATE OF DISCHARGE:                              HISTORY & PHYSICAL   This 77 year old married Caucasian gentleman who is admitted from home  because of recent failure to thrive and progressive weakness.  Today, he  was so weak, that is, he could not get out of bed.  He has a past  history of a known ischemic cardiomyopathy.  He had a  history of an  acute inferior wall myocardial infarction in 1977 while playing  basketball.  He had a second myocardial infarction in 1985.  Following  his second MI, he had coronary artery bypass graft surgery.  He has done  well over the years.  His last nuclear stress test was in 2005 and at  that time, he had evidence of an old inferior wall myocardial infarction  and no reversible ischemia and his left ventricular ejection fraction,  which previously had been 39% was up to 42%.  More recently, the patient  developed problems with acute-on-chronic congestive heart failure.  He  developed anasarca with weight going up to 195 and had an echocardiogram  on April 22, 2008, which showed an ejection fraction of 35-40% with  evidence of both systolic dysfunction and diastolic dysfunction.  He was  begun on Lasix, and we subsequently added spironolactone.  As noted, his  weight was 195 in November and on May 12, 2008,    DICTATION ENDED AT THIS POINT.           ______________________________  Cassell Clement, M.D.     TB/MEDQ  D:  06/29/2008  T:  06/30/2008  Job:  [id number]   cc:   Petra Kuba, M.D.

## 2010-10-25 NOTE — H&P (Signed)
[name] [name], [name] [name]               ACCOUNT NO.:  [id number]   MEDICAL RECORD NO.:  [id number]          PATIENT TYPE:  INP   LOCATION:  2008                         FACILITY:  [facility]   PHYSICIAN:  Cassell Clement, M.D. DATE OF BIRTH:  Nov 28, 1924   DATE OF ADMISSION:  06/29/2008  DATE OF DISCHARGE:                              HISTORY & PHYSICAL   CHIEF COMPLAINT:  Failure to thrive, weakness, and dizziness.   HISTORY:  This is an 77 year old married Caucasian gentleman who is  admitted from home.  His wife called stating that the patient was too  weak to sit up in bed.  He has a history of known coronary artery  disease.  He had an acute inferior wall myocardial infarction in 1977  while playing basketball.  He had another infarct in 1985, following  which he underwent coronary artery bypass graft surgery.  Subsequent to  that, he has had no further problems with angina pectoris.  He did have  a nuclear stress test in 2005 showing evidence of an old inferior wall  MI, but no reversible ischemia and his ejection fraction at that time  was 42% by nuclear study.  The patient was recently seen as scheduled on  April 22, 2008, with weight gain to 195 pounds with physical findings  of anasarca.  He had an echocardiogram that day showing ejection  fraction of 35-40% with combined systolic and diastolic dysfunction.  He  was begun on Lasix and subsequently was placed on spironolactone and was  continued on ACE inhibitors.  Carvedilol was added to his regimen.  The  patient has diuresed nicely, but on his last visit to the office,  May 26, 2008, weight was down to 171 and he was feeling better.   HOME MEDICATIONS:  1. Potassium chloride 20 mEq daily.  2. Furosemide 40 mg daily.  3. Carvedilol 6.25 mg twice a day.  4. Aricept 10 mg a day.  5. Plavix 75 mg daily.  6. Aspirin 325 mg daily.  7. Namenda 10 mg b.i.d.  8. Allopurinol 100 mg b.i.d.   FAMILY HISTORY:  Positive for heart  disease.   SOCIAL HISTORY:  He is a retired [employer] fundraiser.  He is retired from [employer].  He is married.  He has two grown sons who live out of  town.  The patient quit smoking in 1985 and he does not drink alcohol.  He used to play a lot of golf until when his dementia worsened to the  point that he could no longer play golf.   He has a past history of having had previous cerebrovascular accident.  He has a history of gout.  He has had a history of dyspepsia and had a  recent appointment to see Dr. Ewing Schlein.  Dr. Ewing Schlein was impressed with his  weight loss and actually has him scheduled for CT scan of the abdomen  later this week, which we will postpone for the time being.  The patient  denies any change in bowel habits, hematochezia, melena, or diarrhea.  He is not having any genitourinary symptoms.  He is not having any cough  or sputum production.  He does have a history of some sciatica down the  left leg intermittently.  All other systems negative in detail.   PHYSICAL EXAMINATION:  VITAL SIGNS:  Blood pressure on arrival was  65/40, pulse was 48 and regular, and oxygen saturation 97% on room air.  GENERAL:  The patient is lethargic, but alert and cooperative.  SKIN:  Dry.  HEENT:  The pupils were equal and reactive.  Sclerae nonicteric.  The  mouth and pharynx reveals dry mucous membranes.  Jugulovenous pressure  reveals dilated external jugular.  The carotids have normal upstroke.  No bruits.  The thyroid has not enlarged or tender.  There is no  lymphadenopathy.  CHEST:  Clear to percussion and auscultation.  HEART:  Distant heart tones without S3 gallop, murmur, click, or rub.  ABDOMEN:  Soft and nontender.  No masses.  EXTREMITIES:  No phlebitis or edema.   LABORATORY DATA:  Chest x-ray is pending.  Labs are pending.  EKG shows  marked sinus bradycardia at 48 per minute with first-degree heart block,  interventricular conduction disturbance and a pattern of an old  inferior  wall MI.   IMPRESSION:  1. Ischemic cardiomyopathy.  2. Probable dehydration secondary to diuretics.  3. Marked bradycardia probably secondary to carvedilol.  4. Dementia.  5. Old cerebrovascular accident.  6. Hyperuricemia.   DISPOSITION:  Hold carvedilol for bradycardia.  Hold ACE inhibitors,  spironolactone, and Lasix for hypertension and give IV fluids to restore  blood pressure.  Use atropine p.r.n. for symptomatic marked bradycardia.           ______________________________  Cassell Clement, M.D.     TB/MEDQ  D:  06/29/2008  T:  06/30/2008  Job:  [id number]

## 2010-10-25 NOTE — Op Note (Signed)
NAMEDIVIT, STIPP               ACCOUNT NO.:  [id number]   MEDICAL RECORD NO.:  [id number]          PATIENT TYPE:  AMB   LOCATION:  [facility]                         FACILITY:  [facility]   PHYSICIAN:  Courtney Paris, M.D.DATE OF BIRTH:  01-Jul-1924   DATE OF PROCEDURE:  04/17/2007  DATE OF DISCHARGE:                               OPERATIVE REPORT   PREOPERATIVE DIAGNOSIS:  Possible recurrent bladder cancer, CIS, left  posterior base.   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   OPERATION:  Transurethral resection of bladder lesion (3 cm) left  posterior base.   ANESTHESIA:  General.   SURGEON:  Courtney Paris, M.D.   BRIEF HISTORY:  This 77 year old patient has had recurrent bladder  cancer, although stage low grade.  The first one was June 2006 and he  had recurrences in September 2006, January 2007, October 2007, and then  a biopsy August 2008 showed chronic inflammation.  He has been getting  BCG maintenance, the last was August 2008.  Cystoscopy in the office  showed a very inflamed area of the posterior left base near an old scar  that looked like possible CIS or possible recurrent tumor.  He enters to  have this removed at this time.   DESCRIPTION OF PROCEDURE:  The patient was placed on the operating table  in the dorsal lithotomy position. After satisfactory induction of  general anesthesia, he was prepped and draped with Betadine and given IV  antibiotics.  The panendoscope was inserted.  No anterior strictures  were seen.  The posterior urethra was not obstructing. The bladder was  entered.  The hyperemic area in the posterior midline base left was  immediately apparent.  It was right adjacent to an old scar. Pictures  were made as well as the normal mucosa.  Using the biopsy cold cup  forceps, I was able to remove about 3 cm of this area with all the  inflamed tissue being removed for pathological examination.   The base  was then fulgurated with the Bugbee electrode to effect good hemostasis.  Post picture was made and the bladder drained.  A B&O suppository was  inserted.  The patient was taken to the recovery room in good condition  to be later discharged as an outpatient without a Foley catheter.  We  will restart his BCG maintenance if the biopsies are negative.      Courtney Paris, M.D.  Electronically Signed     HMK/MEDQ  D:  04/17/2007  T:  04/17/2007  Job:  [id number]

## 2010-10-28 NOTE — H&P (Signed)
[place]  Patient:    [name] [name], [name] [name] Visit Number: [id number] MRN: [id number]          Service Type: MED Location: [id number] [phone] Attending Physician:  Mick Sell Dictated by:   Deanna Artis. Sharene Skeans, M.D. Admit Date:  11/19/2001 Discharge Date: 11/21/2001   CC:         Thomas A. Patty Sermons, M.D.   History and Physical  DATE OF BIRTH:  Nov 22, 2024  CHIEF COMPLAINT:  Left-sided weakness and slurred speech.  HISTORY OF PRESENT ILLNESS:  The patient is a 77 year old, married, right-handed, Caucasian gentleman who has had migraine with aura since he was in the [employer].  Recently the migraines have increased to about three per week.  After a round of golf, the patient had onset of a visual aura which consists of a large central scotoma.  Simultaneously he experienced weakness of the left arm, leg, and face and slurred speech.  His wife called Dr. Maisie Fus A. Brackbills office and was advised to bring him to [facility].  Halfway there his symptoms began to improve.  He arrived at 1552 hours and was seen by the ER physician at 1555 hours.  CT scan reportedly at 1524 hours (must be 1624 hours).  His symptoms had subsided by 1610 hours. I was called after work-up was completed at 1808 hours.  Cranial CT scan without contrast was normal.  Carotid Doppler showed no hemodynamically significant lesion in the internal carotids and antegrade vertebral flow.  EKG showed a first degree sinus AV block.  CBC, PT, PTT, and CMET were normal with exception of an albumin of 3.2.  PAST MEDICAL HISTORY: 1. Myocardial infarction in 1977. 2. Hypercholesterolemia. 3. Hypertension. 4. Remote smoking history. 5. Gout.  PAST SURGICAL HISTORY: 1. Coronary artery bypass graft x 6 in 1985. 2. Multiple biopsies of skin for skin cancer.  MEDICATIONS: 1. Altace 2.5 mg per day. 2. Atenolol 25 mg per day. 3. Zocor 20 mg per day. 4. Aspirin 325  mg per day. 5. Ibuprofen 300 mg per day. 6. Allopurinol 100 mg per day. 7. Fiorinal one at the onset of migraines. 8. Nitrostat 0.4 mg as needed for angina.  ALLERGIES:  No allergies to medicines.  FAMILY HISTORY:  No family history of stroke or atherosclerotic cardiovascular disease.  SOCIAL HISTORY:  The patient is married.  He is retired and self-sufficient. He quit smoking in 1985.  PHYSICAL EXAMINATION:  Blood pressure 126/81, resting pulse 62, respirations 17, pulse oximetry 95% on 2 L.  HEENT:  No signs of infection.  NECK:  No bruits.  LUNGS:  Clear.  HEART:  No murmurs.  Pulses normal.  ABDOMEN:  Soft and nontender.  Bowel sounds normal.  No hepatosplenomegaly.  EXTREMITIES:  No edema, cyanosis, or altered tone.  NEUROLOGIC:  Mental Status:  The patient was awake, alert, and oriented.  No dysphasia or dyspraxia.  Cranial Nerves:  Round, reactive pupils.  Fundi normal.  Visual fields full to double simultaneous stimuli.  Okay end responses equal.  Symmetric facial strength.  Midline tongue and uvula.  Air conduction greater than bone conduction bilaterally.  Motor Examination: Normal strength, tone, and mass.  Good fine motor movements.  No pronator drift.  Sensation:  Mild stocking peripheral stocking neuropathy to cold and vibration.  He had good stereoagnosis.  Cerebellar Examination:  Good finger-to-nose and rapid repetitive alternating movements.  Gait was slightly broad based.  He could get up on his heels  and toes.  Tandem was little difficult.  Romberg response was negative.  Deep tendon reflexes were diminished and equal.  He had bilateral flexor plantar responses.  IMPRESSION: 1. Transient ischemic attack, right brain.  435.8 2. Hypertension, controlled.  404.10 3. Dyslipidemia.  It is unclear if he has complete control.  Thomas A.    Brackbill, M.D., has checked a lipid panel within the past month and his    triglycerides may be high. 4. Remote  smoking. 5. Atherosclerotic cardiovascular disease.  PLAN: 1. Admit the patient to the hospital for oxygen and fluids.  Continue his    current medications.  No heparin at this time. 2. MRI and MRA intracranial. 3. 2-D echocardiogram.  Dr. Clovis Pu. Brackbills office to interpret. 4. Home after work-up if all is negative.  I have discussed this with the patient and his wife and they are in agreement. Meade Maw, M.D., has been contacted concerning this patient and asked me to see the patient for [facility] A. Patty Sermons, M.D. Dictated by:   Deanna Artis. Sharene Skeans, M.D. Attending Physician:  Mick Sell DD:  11/19/01 TD:  11/21/01 Job: [id number]

## 2010-10-28 NOTE — Op Note (Signed)
[name] [name], [name] [name]               ACCOUNT NO.:  [id number]   MEDICAL RECORD NO.:  [id number]          PATIENT TYPE:  AMB   LOCATION:  [facility]                         FACILITY:  [facility]   PHYSICIAN:  Courtney Paris, M.D.DATE OF BIRTH:  27-Oct-1924   DATE OF PROCEDURE:  08/28/2006  DATE OF DISCHARGE:                               OPERATIVE REPORT   This 77 year old patient is admitted with recurrent bladder cancer.  These have all been low-grade, low-stage tumors.  His first one was June  2006, recurrence September 2006, January 2007, and October 2007.  He  just finished a 6-week course of BCG in January 2008.  He is having no  symptoms but on cystoscopy had some recurrent tumors and enters to have  these resected at this time.   The patient was placed on the operating table in the dorsal lithotomy  position. After satisfactory induction of general anesthesia, he was  prepped and draped with Betadine in the usual sterile fashion.  Panendoscope was inserted.  The prostate was mildly enlarged in a  trilobar fashion, and the bladder was inspected using the right-angle  lens.  The lesions at the right left posterior midline base were  photographed, as well as one on either side of the orifices.  There were  just little patches of red hyperemia and slightly raised tissue.  With  the cold cup biopsy forceps, each of these were then removed, and  cautery with a Bugbee electrode was then used to effect good hemostasis.  The pieces were sent for pathologic examination, and the patient was  taken to the recovery room in good condition.  He will be later  discharged as an outpatient, and a Foley catheter was not left.  He will  come back to the office, and we will decide whether to continue the BCG  or to and alpha interferon as an adjunct to his bladder immunotherapy.      Courtney Paris, M.D.  Electronically Signed     HMK/MEDQ  D:  08/28/2006  T:  08/28/2006  Job:  [id number]

## 2010-10-28 NOTE — Op Note (Signed)
[name] [name], [name] [name]               ACCOUNT NO.:  [id number]   MEDICAL RECORD NO.:  [id number]          PATIENT TYPE:  AMB   LOCATION:  [facility]                         FACILITY:  [facility]   PHYSICIAN:  Rozanna Boer., M.D.DATE OF BIRTH:  [birth year]   DATE OF PROCEDURE:  11/17/2004  DATE OF DISCHARGE:                                 OPERATIVE REPORT   PREOP DIAGNOSIS:  Large bladder tumor right posterior base.   POSTOP DIAGNOSIS:  Large bladder tumor right posterior base.   OPERATION:  Transurethral resection, bladder tumor, greater than 5-cm right  posterior base.   ANESTHESIA:  General.   SURGEON:  Courtney Paris, M.D.   BRIEF HISTORY:  This 77 year old patient presented last week with a several-  day history of hematuria and some urgency incontinence. Office cystoscopy  showed a large tumor of the right base and trigone of the bladder. I could  not see the orifices so a CT scan was done which showed no hydronephrosis  and no upper tract lesions. He enters now to have this large tumor resected  which looks fairly exophytic.   The patient was placed on the operating table in the dorsal lithotomy  position after satisfactory induction of general anesthesia; was prepped and  draped in the usual sterile fashion; and given IV antibiotics. The 28  Olympus continuous flow resectoscope was inserted and the bladder inspected.  The bladder was free of any mucosal lesions, but there was a large tumor  encompassing most of the right base coming up to the trigone.  It had some  blood clot adherent to it, but it did looked fairly exophytic in nature.  After photographing this, I used the resectoscope loop and was able to find  the base and transect this.  It was right on top of the right ureteral  orifice, but I did take resect a fairly large area around this to make sure  that I got all of the tumor. Hemostasis was quite good.  I cauterized the  edges and then rephotographed the  area of resection which was about 5 cm or  a little more in size. I removed the chips and then placed a #22 Foley  catheter which was passed without difficulty and the irrigant was clear.   The patient taken recovery room in good condition; will be later sent home  with his catheter, for a week, and then will remove this; and come to the  office for followup.  He will be called regarding the path report, hopefully  it is superficial and low-grade in nature; although it looked like a grade  2. There was minimal blood loss. The patient tolerated this well.       HMK/MEDQ  D:  11/17/2004  T:  11/17/2004  Job:  [id number]

## 2010-10-28 NOTE — Discharge Summary (Signed)
[place]  Patient:    [name] [name], [name] [name] Visit Number: [id number] MRN: [id number]          Service Type: MED Location: [phone] Attending Physician:  Mick Sell Dictated by:   Candy Sledge, M.D. Admit Date:  11/19/2001 Discharge Date: 11/21/2001   CC:         Thomas A. Patty Sermons, M.D.   Discharge Summary  HISTORY OF PRESENT ILLNESS:  For complete details of the chief complaint, history of present illness, past medical history and physical examination please refer to Dr. Carlyon Prows admission history and physical. 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 Migraine auras had recently increased to about three times per week. Following a round of golf, the patient presented with a visual aura, consisting of a large central scotoma. Simultaneously he experienced weakness in the left arm, leg and face and slurred speech. The patient was advised to come to the emergency room, and en route, his symptoms began to improve.  PAST MEDICAL HISTORY: 1. Myocardial infarction in 1977. 2. Hypercholesterolemia. 3. Hypertension. 4. Remote smoking history. 5. Gout. 6. Status post CABG x 6 in 1985.  MEDICATIONS ON ADMISSION: 1. Altace 2.5 mg p.o. q.d. 2. Atenolol 25 mg p.o. q.d. 3. Zocor 20 mg q.d. 4. Aspirin 325 mg p.o. q.d. 5. Ibuprofen 300 mg q.d. 6. Allopurinol 100 mg q.d. 7. Fiorinal p.r.n. migraine headaches. 8. Nitrostat p.r.n.  PHYSICAL EXAMINATION ON ADMISSION: Showed fluent speech. Cranial nerve examination was unremarkable. He had good strength throughout. Deep tendon reflexes were diminished and equal with downgoing toes. Sensation was intact.  HOSPITAL COURSE:  The patient was admitted with a diagnosis of transient ischemic attack involving the right brain, hypertension controlled and dyslipidemia. The patient was admitted for  further workup.  LABORATORY FINDINGS: Admission CBC and differential showed a white blood cell count of 9.8000, hemoglobin 16.1 and hematocrit 46.8, platelet count was 239,000. PT and PTT were 13.4 and 27 seconds respectively. Comprehensive metabolic panel showed all indices to be within normal limits except for mildly low albumin at 3.2 gm/dL. Homocysteine level was normal at 9.21. Urinalysis was negative.  A CT scan of the head without contrast on November 19, 2001, showed no acute intracranial abnormality. An MRI of the brain without contrast on November 20, 2001, showed small, hyperacute infarct involving the posterior limb of the right internal capsule and some atrophy with mild to moderate small vessel disease type changes. 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 This was not felt to require further evaluation. A 12-lead EKG showed normal sinus rhythm normal sinus rhythm with first degree AV block. Carotid Doppler studies showed no evidence of significant internal carotid artery stenosis bilaterally. Vertebral artery flow was antegrade bilaterally.  The results of the 2D echocardiogram are pending at this time.  HOSPITAL COURSE: The patient was admitted to the neurosciences unit. His vital signs remained stable during this brief hospitalization. Repeat neurologic examinations x 2 showed essentially normal speech, although the patient did not feel himself that his speech was quite 100%. He demonstrated excellent strength throughout. Sensation was normal. Deep tendon reflexes were equal. The patient was ambulating without problems. He underwent the above workup without problems and he was felt to have arrived at maximum hospital benefit on November 21, 2001, at which time he was discharged.  FINAL DIAGNOSES: 1. 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 2. History of hypercholesterolemia. 3. History of hypertension. 4. History of myocardial infarction.  DISCHARGE MEDICATIONS: 1. Altace 2.5 mg p.o. q.d. 2. Tenormin 25 mg p.o. q.d. 3. Zocor 20 mg p.o. q.d. 4. Aspirin 325 mg p.o. q.d. 5. Allopurinol 100 mg p.o. q.d. 6. Plavix 75 mg p.o. q.d. 7. Fioricet p.r.n.  DISPOSITION: The patient is discharged to home.  FOLLOWUP:  The patient was to follow up with Demetrio Lapping, P.A. in our office and with Dr. Patty Sermons as needed.  DISCHARGE INSTRUCTIONS:  He was advised to avoid heavy exertion over the next five to seven days, and to follow a low cholesterol diet.  CONDITION ON DISCHARGE:  Stable, prognosis good. Dictated by:   Candy Sledge, M.D. Attending Physician:  Mick Sell DD:  12/05/01 TD:  12/06/01 Job: [id number]

## 2010-10-28 NOTE — Op Note (Signed)
[name] [name], [name] [name]               ACCOUNT NO.:  [id number]   MEDICAL RECORD NO.:  [id number]          PATIENT TYPE:  AMB   LOCATION:  [facility]                         FACILITY:  [facility]   PHYSICIAN:  Houston M. Kimbrough, M.D.DATE OF BIRTH:  Oct 28, 1924   DATE OF PROCEDURE:  03/01/2005  DATE OF DISCHARGE:                                 OPERATIVE REPORT   PREOPERATIVE DIAGNOSIS:  Recurrent bladder tumor left lateral wall, and  inflammatory lesions, right lateral wall.   POSTOPERATIVE DIAGNOSIS:  Recurrent bladder tumor left lateral wall, and  inflammatory lesions, right lateral wall.   OPERATION:  Transurethral resection of bladder tumor left lateral wall (2  cm) and bladder biopsy x2 right lateral wall.   ANESTHESIA:  General.   SURGEON:  Courtney Paris, M.D.   BRIEF HISTORY:  This 77 year old patient was admitted with recurrent bladder  tumor for TURBT.  This was done on routine office surveillance cystoscopy.  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  He had  a coronary bypass 1985, umbilical hernia repair in 1992, VAS in 1976.  He  had a history of previous stroke.  He has glaucoma and had an MI in 80 and  1985.  He went off his Plavix and aspirin a week before the procedure and  enters now for resection of these lesions seen on routine office  surveillance cystoscopy.   DESCRIPTION OF PROCEDURE:  The patient was placed on the operating table in  the dorsal lithotomy position after satisfactory induction of general  anesthesia.  The anterior urethra was inspected. No strictures seen.  The  posterior urethra was mildly obstructing in a bilobar fashion.  The bladder  was entered.  The tumors on the left lateral wall just lateral to the left  ureteral orifice were photographed and the scar on the right lateral wall  just behind the orifice on the right side where it was also photographed  with two small lesions that were adjacent to the scar.   These did not look  particularly worrisome.  Using the cold cup biopsy forceps, I was able to  completely remove the tumors on the left lateral wall.  An area of about 2  cm was resected then in this way.  The the biopsy on the right lateral wall  was closest to the orifice and was labeled right bladder wall #1, and the  one a little more lateral was labeled #2.  It looked like it may have had  some small calcifications on it.  The areas were then fulgurated with the  Bugbee electrode to effect good hemostatic control.  The bladder was  drained, scope removed and a Foley was not left.  He was given a B&O  suppository and will be taken to the recovery room in good condition where  he will later be observed to make sure he can void before he can leave  outpatient.  Come back in one week for followup.  He will be called  regarding the biopsy report when available.     Houston Celesta Aver,  M.D.  Electronically Signed    HMK/MEDQ  D:  03/01/2005  T:  03/01/2005  Job:  [id number]

## 2010-10-28 NOTE — Op Note (Signed)
[name] [name], [name] [name]               ACCOUNT NO.:  [id number]   MEDICAL RECORD NO.:  [id number]          PATIENT TYPE:  AMB   LOCATION:  [facility]                         [facility]ACILITY:  [facility]   PHYSICIAN:  Courtney Paris, M.D.DATE OF BIRTH:  11/28/1924   DATE OF PROCEDURE:  06/14/2005  DATE OF DISCHARGE:                                 OPERATIVE REPORT   PREOPERATIVE DIAGNOSIS:  Recurrent bladder tumors anterior dome, right  trigone.   POSTOPERATIVE DIAGNOSIS:  Recurrent bladder tumors anterior dome, right  trigone.   OPERATION:  TURBT multiple bladder tumors (3.5 cm anterior dome), right  trigone.   ANESTHESIA:  General.   SURGEON:  Courtney Paris, M.D.   BRIEF HISTORY:  This 77 year old patient had his first tumor, a large  polypoid tumor, at the base of bladder resected June 2006. A cystoscopy in  September was negative and on routine office cystoscopy, he had 8-10 tumors  on the anterior dome and one near the right trigone that were noticed. He  enters to have these resected at this time.   DESCRIPTION OF PROCEDURE:  The patient was placed on the operating table in  dorsal lithotomy position and after satisfactory induction of general  anesthesia, he was prepped and draped with Betadine in the usual sterile  fashion and given IV antibiotics. The panendoscope was inserted and anterior  urethra was normal, posterior urethra was nonobstructing. The bladder was  entered. The lesion just next to the right ureteral orifice was photographed  as well as a few small, yellowish stones which were present in the base of  the bladder and another small tumor in the center of the scar from the  previous resection but most of the tumors were at the anterior dome. I had  to press down with my hand on the patient's abdomen to be able to see these.  These were photographed and then using the cold cup biopsy forceps, about  3.5 cm of anterior bladder wall was resected in this fashion. This  seemed to  get all the tumors as well as the one on the right trigone and then the  Bugbee was used to fulgurate the tumor in the middle of the previous scar  and to affect good hemostasis. This was quite good. The bladder was fairly  thick and I did not see the need to leave a Foley catheter. He will was sent  to the recovery room in good condition to be later discharged if he is able  to void without a catheter and come back in the office in a week. He may  need postoperative BCG if these continue to recur.      Courtney Paris, M.D.  Electronically Signed     HMK/MEDQ  D:  06/14/2005  T:  06/14/2005  Job:  [id number]

## 2010-10-28 NOTE — Op Note (Signed)
[name] [name], [name] [name]                           ACCOUNT NO.:  [id number]   MEDICAL RECORD NO.:  [id number]                   PATIENT TYPE:  AMB   LOCATION:  ENDO                                 FACILITY:  [facility]   PHYSICIAN:  Petra Kuba, M.D.                 DATE OF BIRTH:  03/28/1925   DATE OF PROCEDURE:  11/14/2002  DATE OF DISCHARGE:                                 OPERATIVE REPORT   PROCEDURE:  Colonoscopy with biopsy.   INDICATION:  The patient is a history of colon polyps, due for repeat  screening.  Consent was signed after risks, benefits, methods, and options  thoroughly discussed in the office in the past.   MEDICINES USED:  1. Demerol 60.  2. Versed 6.   DESCRIPTION OF PROCEDURE:  Rectal inspection was pertinent for external  hemorrhoids, small.  Digital exam was negative.  The video pediatric  adjustable colonoscope was inserted and with some difficulty due to a long,  looping colon, was able to be advanced to the cecum.  This did require  rolling him on his back and some abdominal pressures.  On insertion, some  left greater than right diverticula were seen.  The cecum was identified by  the appendiceal orifice and the ileocecal valve.  The prep was adequate.  There was some liquid stool that required washing and suctioning.  On slow  withdrawal through the colon, in the cecum, tiny 1-2 mm polyp was seen, was  cold biopsied x2, put in the first container.  Other then the left greater  than right diverticula, another tiny sigmoid polyp was seen and was cold  biopsied x2 and put in a second container.  No other polypoid lesions,  masses, or other abnormalities were seen as we slowly withdrew back to the  rectum.  Once back in the rectum, the scope was retroflexed, pertinent for  some internal hemorrhoids.  Anorectal pull-through confirmed the above.  The  scope was reinserted a short ways up the left side of the colon; air was  suctioned, scope removed.  The patient  tolerated the procedure well.  There  was no obvious immediate complication.   ENDOSCOPIC DIAGNOSES:  1. Internal-external hemorrhoids.  2. Left greater than right diverticula.  3. Two tiny polyps, one in the sigmoid and one in the cecum, cold biopsied.  4. Otherwise within normal limits to the cecum.   PLAN:  1. Yearly rectals and guaiacs per Cassell Clement, M.D.  2.     Happy to see back p.r.n.  3. Await pathology but consider repeat screening prior to this in five years     if doing well medically but question whether he will be a candidate for     that.  Petra Kuba, M.D.    MEM/MEDQ  D:  11/14/2002  T:  11/14/2002  Job:  [id number]   cc:   Cassell Clement, M.D.  [address], [address]  [place]  [place] [address]  Fax: [phone]

## 2010-10-28 NOTE — Procedures (Signed)
[facility]  Patient:    [name] [name], [name] [name]                        MRN: [id number] Proc. Date: 12/05/99 Adm. Date:  [id number] Disc. Date: [id number] Attending:  Nelda Marseille CC:         Petra Kuba, M.D.             Thomas A. Patty Sermons, M.D.                           Procedure Report  PROCEDURE PERFORMED:  Colonoscopy and polypectomy.  INDICATIONS FOR PROCEDURE:  Patient with anemia and history of colon polyps. Overdue for colonic screening.  Consent was signed after risks, benefits, method and options thoroughly discussed in the office.  MEDICATIONS USED:  Demerol 50 mg, Versed 3 mg.  DESCRIPTION OF PROCEDURE:  Rectal inspection is pertinent for external hemorrhoids.  Digital exam was negative.  Video colonoscope was inserted fairly easily and advanced around the colon to the cecum.  This stage required rolling him on his back and some abdominal pressure  On insertion small and medium sized proximal descending polyps were seen.  Also, some left-sided diverticula were seen.  The cecum was identified by the appendix and ileocecal valve.  In fact, the scope was inserted a short ways in the terminal ileum, which was normal.  Photo documentation was obtained.  Scope was slowly withdrawn.  The prep was adequate.  There was some liquid stool that required washing and sectioning.  In the cecum a 3 mm sessile polyp was seen and very carefully with ascending 20/20 hot biopsy.  Scope was further withdrawn to the level of the hepatic flexure.  A 2 mm polyp was seen and was hot biopsied and also in the hepatic flexure a possible other small polyps was seen behind a fold.  It was cold biopsied x 1.  We are not sure we got it all, but on rolling him back on his left side and on his back, we are not sure we could find any further.  This was small.  Scope was further withdrawn for additional small transverse polyps seen and were hot biopsied x 1 or 2.  He did have  a little oozing from a few of the polyps which required second hot biopsies.  We increased the cautery to 25/25.  As the scope was withdrawn around the splenic flexure, a medium-sized polyp seen on insertion was seen and electrocautery applied and the polyp was suctioned through the scope.  The scope was removed and the polyp recovered.  The scope was reinserted to the polypectomy site. Two other descending polyps were seen.  One was seen on insertion.  Both were hot biopsied x 2.  The scope was then slowly withdrawn.  Other than left-sided diverticula, no other polyps were seen as we slowly withdrew back to the rectum.  Once back in the rectum the scope was then retroflexed, pertinent for some internal hemorrhoids. The scope was straightened and re-advanced a short ways up the sigmoid, air was suctioned and the scope removed.  Patient tolerated the procedure well.  There was no obvious immediate complication.  ENDOSCOPIC DIAGNOSES: 1. Internal and external hemorrhoids. 2. Left-sided diverticula. 3. Multiple small polyps, in the descending transverse, ascending and cecum,    status post hot biopsy with one in the hepatic flexure, small, possibly not    completely  removed. 4. Medium sized polyp in the proximal descending, status post snare and    removal. 5. Otherwise within normal limits to the terminal ileum.   PLAN: 1. Will await pathology to determine further colonic screening. 2. Follow up in six weeks to recheck to recheck guaiac, hemoglobin and decide    any further workup and plans, upper GI with small bowel follow through. 3. Have him call me sooner p.r.n. or in two weeks. DD:  12/05/99 TD:  12/06/99 Job: [id number]

## 2010-10-28 NOTE — Op Note (Signed)
[name] [name], [name] [name]               ACCOUNT NO.:  [id number]   MEDICAL RECORD NO.:  [id number]          PATIENT TYPE:  AMB   LOCATION:  [facility]                         FACILITY:  [facility]   PHYSICIAN:  Courtney Paris, M.D.DATE OF BIRTH:  01-07-1925   DATE OF PROCEDURE:  04/11/2006  DATE OF DISCHARGE:                                 OPERATIVE REPORT   PREOPERATIVE DIAGNOSIS:  Recurrent bladder tumor right posterior base.   POSTOPERATIVE DIAGNOSIS:  Recurrent bladder tumor right posterior base.   OPERATION:  TUR bladder tumor (small, 1 cm).   ANESTHESIA:  General.   SURGEON:  Courtney Paris, M.D.   BRIEF HISTORY:  This 77 year old patient is admitted with recurrent low  grade bladder tumors.  His first was November 15, 2005, grade I.  He had two  small recurrences February 11, 2006, another one January 2007, and negative  cystos April 2007 and July 2007, but routine office surveillance cysto last  week, he had another tumor.  He stopped his Plavix and aspirin and comes in  for resection of this at this time.   The patient was placed on the operating table in dorsal lithotomy position.  After satisfactory induction of general anesthesia, was prepped and draped  with Betadine in the usual sterile fashion.  He was given IV Cipro.  The  panendoscope showed a short mildly obstructing prostate.  The bladder was  entered and the tumor on the right posterior base was identified and  photographed.  A biopsy forceps was used to remove the tumor which was about  a centimeter across but had a fairly narrow stalk.  Pictures were made  postop and the Bugbee was used to coagulate this remaining area.  Again,  about a centimeter was treated.  The bladder was drained, it was quite dry,  scope removed, a catheter was not left and he was taken to the recovery room  in good condition and later discharged as an outpatient.  Will probably  start BCG on him in a month to prevent these low grade  recurrences.      Courtney Paris, M.D.  Electronically Signed     HMK/MEDQ  D:  04/11/2006  T:  04/11/2006  Job:  [id number]

## 2010-11-21 ENCOUNTER — Other Ambulatory Visit: Payer: Self-pay | Admitting: *Deleted

## 2010-11-21 DIAGNOSIS — F0390 Unspecified dementia without behavioral disturbance: Secondary | ICD-10-CM

## 2010-11-21 MED ORDER — DONEPEZIL HCL 10 MG PO TABS: 10.0000 mg | ORAL_TABLET | Freq: Every evening | ORAL | Status: DC | PRN

## 2010-11-21 NOTE — Telephone Encounter (Signed)
Refilled meds per fax request.  

## 2010-11-28 ENCOUNTER — Encounter: Payer: Self-pay | Admitting: Cardiology

## 2010-12-06 ENCOUNTER — Other Ambulatory Visit (INDEPENDENT_AMBULATORY_CARE_PROVIDER_SITE_OTHER): Payer: Medicare Other | Admitting: *Deleted

## 2010-12-06 ENCOUNTER — Ambulatory Visit (INDEPENDENT_AMBULATORY_CARE_PROVIDER_SITE_OTHER): Payer: Medicare Other | Admitting: Cardiology

## 2010-12-06 ENCOUNTER — Encounter: Payer: Self-pay | Admitting: Cardiology

## 2010-12-06 DIAGNOSIS — Z951 Presence of aortocoronary bypass graft: Secondary | ICD-10-CM | POA: Insufficient documentation

## 2010-12-06 DIAGNOSIS — Z9889 Other specified postprocedural states: Secondary | ICD-10-CM

## 2010-12-06 DIAGNOSIS — E785 Hyperlipidemia, unspecified: Secondary | ICD-10-CM

## 2010-12-06 DIAGNOSIS — F0390 Unspecified dementia without behavioral disturbance: Secondary | ICD-10-CM | POA: Insufficient documentation

## 2010-12-06 DIAGNOSIS — Z79899 Other long term (current) drug therapy: Secondary | ICD-10-CM

## 2010-12-06 DIAGNOSIS — I5042 Chronic combined systolic (congestive) and diastolic (congestive) heart failure: Secondary | ICD-10-CM

## 2010-12-06 DIAGNOSIS — T82218A Other mechanical complication of coronary artery bypass graft, initial encounter: Secondary | ICD-10-CM | POA: Insufficient documentation

## 2010-12-06 LAB — BASIC METABOLIC PANEL
Calcium: 9.3 mg/dL (ref 8.4–10.5)
GFR: 53.55 mL/min — ABNORMAL LOW (ref >60.00)
Glucose, Bld: 109 mg/dL — ABNORMAL HIGH (ref 70–99)
Potassium: 3.6 mEq/L (ref 3.5–5.1)
Sodium: 142 mEq/L (ref 135–145)

## 2010-12-06 LAB — HEPATIC FUNCTION PANEL
AST: 22 U/L (ref 0–37)
Alkaline Phosphatase: 52 U/L (ref 39–117)
Bilirubin, Direct: 0.1 mg/dL (ref 0.0–0.3)

## 2010-12-06 LAB — LIPID PANEL
HDL: 35.6 mg/dL — ABNORMAL LOW (ref >39.00)
LDL Cholesterol: 59 mg/dL (ref 0–99)
Total CHOL/HDL Ratio: 4
VLDL: 32.8 mg/dL (ref 0.0–40.0)

## 2010-12-06 NOTE — Assessment & Plan Note (Signed)
Patient has a history of dementia.  He is on Aricept and Namenda.  This may have slowed the progression of the disease slightly.  The patient no longer drives a car.  He is quite dependent on his wife to manage the household affairs.

## 2010-12-06 NOTE — Assessment & Plan Note (Signed)
The patient has a past history of coronary artery bypass graft surgery in 1985.  He had a remote inferior wall myocardial infarction.  He has not been experiencing any recurrent angina pectoris.  His last nuclear stress test was 03/25/04 and at that time he showed an old inferior wall MI but no reversible ischemia and he had inferior wall akinesis with an overall ejection fraction of 42%.

## 2010-12-06 NOTE — Progress Notes (Signed)
[name] [name] Date of Birth:  11/12/1924 [facility] [address]   [address] [place], [place]  [address] [phone]           Fax   [phone]  History of Present Illness: This 77 year old gentleman is seen for a scheduled 4 month followup office visit.  He has a past history of known ischemic heart disease.  He had a remote inferior wall myocardial infarction.  He had coronary artery bypass graft surgery subsequent to that in 1985.  He has a history of congestive heart failure secondary to combined systolic and diastolic CHF.  He was hospitalized in January 2010 for severe exacerbation of acute on chronic combined systolic and diastolic CHF.  He's not having any recurrent angina.  His last nuclear stress test was in 2005 and showed no reversible ischemia and his ejection fraction was 42% with inferior wall akinesis.  The patient has a history of severe dementia.  He is married and lives with his wife.  He no longer drives a car.  Current Outpatient Prescriptions  Medication Sig Dispense Refill  . aspirin 81 MG tablet Take 81 mg by mouth daily.        . brimonidine (ALPHAGAN P) 0.1 % SOLN Apply to eye 2 (two) times daily.        . Calcium Carbonate-Vitamin D (CALTRATE 600+D PO) Take by mouth daily.        . clopidogrel (PLAVIX) 75 MG tablet Take 75 mg by mouth daily.        . furosemide (LASIX) 40 MG tablet Take 40 mg by mouth daily.        Marland Kitchen ibuprofen (ADVIL,MOTRIN) 200 MG tablet Take 200 mg by mouth every 6 (six) hours as needed.        . latanoprost (XALATAN) 0.005 % ophthalmic solution 1 drop at bedtime.        . memantine (NAMENDA) 10 MG tablet Take 1 tablet (10 mg total) by mouth 2 (two) times daily.  180 tablet  3  . Multiple Vitamin (MULTIVITAMIN) tablet Take 1 tablet by mouth daily.        . nitroGLYCERIN (NITROSTAT) 0.4 MG SL tablet Place 0.4 mg under the tongue every 5 (five) minutes as needed.        . simvastatin (ZOCOR) 20 MG tablet Take 20 mg  by mouth at bedtime.        Marland Kitchen DISCONTD: donepezil (ARICEPT) 10 MG tablet Take 1 tablet (10 mg total) by mouth at bedtime as needed.  90 tablet  3    Allergies  Allergen Reactions  . Other     ZPAK    Patient Active Problem List  Diagnoses  . Chronic combined systolic and diastolic congestive heart failure  . Dyslipidemia  . Dementia  . Hx of CABG    History  Smoking status  . Former Smoker  . Quit date: 06/13/1983  Smokeless tobacco  . Never Used    History  Alcohol Use No    Family History  Problem Relation Age of Onset  . Heart disease Father     Review of Systems: Constitutional: no fever chills diaphoresis or fatigue or change in weight.  Head and neck: no hearing loss, no epistaxis, no photophobia or visual disturbance. Respiratory: No cough, shortness of breath or wheezing. Cardiovascular: No chest pain peripheral edema, palpitations. Gastrointestinal: No abdominal distention, no abdominal pain, no change in bowel habits hematochezia or melena. Genitourinary: No dysuria, no  frequency, no urgency, no nocturia. Musculoskeletal:No arthralgias, no back pain, no gait disturbance or myalgias. Neurological: No dizziness, no headaches, no numbness, no seizures, no syncope, no weakness, no tremors. Hematologic: No lymphadenopathy, no easy bruising. Psychiatric: No  hallucinations, no sleep disturbance.Patient does have evidence of dementia with marked short-term memory loss    Physical Exam: Filed Vitals:   12/06/10 1055  BP: 120/70  Pulse: 66  The general appearance reveals a well-developed well-nourished elderly gentleman in no distress.The head and neck exam reveals pupils equal and reactive.  Extraocular movements are full.  There is no scleral icterus.  The mouth and pharynx are normal.  The neck is supple.  The carotids reveal no bruits.  The jugular venous pressure is normal.  The  thyroid is not enlarged.  There is no lymphadenopathy.  The chest is clear to  percussion and auscultation.  There are no rales or rhonchi.  Expansion of the chest is symmetrical.  The precordium is quiet.  The first heart sound is normal.  The second heart sound is physiologically split.  There is no murmur gallop rub or click.  There is no abnormal lift or heave.  The abdomen is soft and nontender.  The bowel sounds are normal.  The liver and spleen are not enlarged.  There are no abdominal masses.  There are no abdominal bruits.  Extremities reveal good pedal pulses.  There is no phlebitis or edema.  There is no cyanosis or clubbing.  Strength is normal and symmetrical in all extremities.  There is no lateralizing weakness.  There are no sensory deficits.  The skin is warm and dry.  There is no rash.   Assessment / Plan: Continue present medication.  Recheck in 4 months for followup office visit and fasting lab work.  He needs to try to limit his carbohydrates and sweets because of elevated triglycerides and weight gain.

## 2010-12-06 NOTE — Assessment & Plan Note (Signed)
The patient has a history of congestive heart failure secondary to combined systolic and diastolic congestive heart failure.  He was hospitalized in January 2010 with severe anasarca.  He responded to diuresis.  Since then he has been doing well on low dose furosemide 40 mg daily.  He denies any orthopnea or paroxysmal nocturnal dyspnea.

## 2010-12-06 NOTE — Assessment & Plan Note (Signed)
Patient has a history of dyslipidemia.  He is on simvastatin.  He is triglycerides remain high because he eats a lot of candy.  He's not having any myalgias from the simvastatin

## 2010-12-07 ENCOUNTER — Telehealth: Payer: Self-pay | Admitting: *Deleted

## 2010-12-07 NOTE — Telephone Encounter (Signed)
Message copied by Adolphus Birchwood on Wed Dec 07, 2010 12:04 PM ------      Message from: Cassell Clement      Created: Tue Dec 06, 2010  1:23 PM       Please report.The electrolytes are satisfactory.  The blood sugar was slightly elevated at 109 he needs to cut back on candy and sweets.The liver tests are normal.The cholesterol was good but the triglycerides are too high because of too many sweets and carbohydrate

## 2010-12-07 NOTE — Telephone Encounter (Signed)
Pt notified of lab results and to continue same medications.  Pt will work on decreasing sweets and carbs.

## 2010-12-12 ENCOUNTER — Other Ambulatory Visit: Payer: Self-pay | Admitting: Cardiology

## 2010-12-12 MED ORDER — CLOPIDOGREL BISULFATE 75 MG PO TABS: 75.0000 mg | ORAL_TABLET | Freq: Every day | ORAL | Status: DC

## 2010-12-12 NOTE — Telephone Encounter (Signed)
Called wanting to know if there was a generic substitute out for plavix and if there was and if would be ok to take, if you would order a 90 day supply for her husband with [facility]. Please call back. I have pulled the chart.

## 2010-12-12 NOTE — Telephone Encounter (Signed)
rx sent at request of wife

## 2011-03-21 LAB — COMPREHENSIVE METABOLIC PANEL
ALT: 13
AST: 19
Albumin: 3.6
CO2: 28
Chloride: 107
Creatinine, Ser: 1.12
GFR calc Af Amer: >60
Potassium: 4.3
Sodium: 143
Total Bilirubin: 0.9

## 2011-03-21 LAB — URINALYSIS, ROUTINE W REFLEX MICROSCOPIC
Nitrite: NEGATIVE
Protein, ur: 30 — AB
Specific Gravity, Urine: 1.019
Urobilinogen, UA: 1

## 2011-03-21 LAB — URINE MICROSCOPIC-ADD ON: Urine-Other: NONE SEEN

## 2011-03-21 LAB — CBC
MCV: 94.7
Platelets: 189
RBC: 4.57
WBC: 9.5

## 2011-03-28 ENCOUNTER — Other Ambulatory Visit: Payer: Medicare Other | Admitting: *Deleted

## 2011-03-28 ENCOUNTER — Ambulatory Visit (INDEPENDENT_AMBULATORY_CARE_PROVIDER_SITE_OTHER): Payer: Medicare Other | Admitting: Cardiology

## 2011-03-28 ENCOUNTER — Other Ambulatory Visit (INDEPENDENT_AMBULATORY_CARE_PROVIDER_SITE_OTHER): Payer: Medicare Other | Admitting: *Deleted

## 2011-03-28 ENCOUNTER — Encounter: Payer: Self-pay | Admitting: Cardiology

## 2011-03-28 VITALS — BP 116/62 | HR 66 | Ht 68.0 in | Wt 181.0 lb

## 2011-03-28 DIAGNOSIS — E7800 Pure hypercholesterolemia, unspecified: Secondary | ICD-10-CM

## 2011-03-28 DIAGNOSIS — Z79899 Other long term (current) drug therapy: Secondary | ICD-10-CM

## 2011-03-28 DIAGNOSIS — Z951 Presence of aortocoronary bypass graft: Secondary | ICD-10-CM

## 2011-03-28 DIAGNOSIS — E785 Hyperlipidemia, unspecified: Secondary | ICD-10-CM

## 2011-03-28 DIAGNOSIS — Z9889 Other specified postprocedural states: Secondary | ICD-10-CM

## 2011-03-28 DIAGNOSIS — I259 Chronic ischemic heart disease, unspecified: Secondary | ICD-10-CM

## 2011-03-28 DIAGNOSIS — F0390 Unspecified dementia without behavioral disturbance: Secondary | ICD-10-CM

## 2011-03-28 LAB — BASIC METABOLIC PANEL
BUN: 20 mg/dL (ref 6–23)
Chloride: 105 mEq/L (ref 96–112)
Creatinine, Ser: 1.1 mg/dL (ref 0.4–1.5)
Glucose, Bld: 100 mg/dL — ABNORMAL HIGH (ref 70–99)

## 2011-03-28 LAB — HEPATIC FUNCTION PANEL
ALT: 15 U/L (ref 0–53)
Alkaline Phosphatase: 50 U/L (ref 39–117)
Bilirubin, Direct: 0.2 mg/dL (ref 0.0–0.3)
Total Bilirubin: 1.1 mg/dL (ref 0.3–1.2)
Total Protein: 7.3 g/dL (ref 6.0–8.3)

## 2011-03-28 LAB — LIPID PANEL
Cholesterol: 133 mg/dL (ref 0–200)
LDL Cholesterol: 59 mg/dL (ref 0–99)

## 2011-03-28 MED ORDER — MEMANTINE HCL 10 MG PO TABS: 10.0000 mg | ORAL_TABLET | Freq: Two times a day (BID) | ORAL | Status: DC

## 2011-03-28 NOTE — Patient Instructions (Signed)
Will refill medications to [facility] Your physician wants you to follow-up in: 4 months You will receive a reminder letter in the mail two months in advance. If you don't receive a letter, please call our office to schedule the follow-up appointment.

## 2011-03-28 NOTE — Assessment & Plan Note (Signed)
The patient has moderate to severe dementia.  Since I last saw him he appears to be about the same.  He is taking [place]

## 2011-03-28 NOTE — Assessment & Plan Note (Signed)
The patient has had a difficult time with trying to lose weight and to adhere to a heart healthy diet.  He is on simvastatin 20 mg daily.  Is not having any myalgias from simvastatin

## 2011-03-28 NOTE — Assessment & Plan Note (Signed)
The patient has had no recurrent chest pain to suggest angina pectoris. 

## 2011-03-28 NOTE — Progress Notes (Signed)
[name] [name] Date of Birth:  05-Mar-1925 [name] [address]   [address] [place], [place]  [address] [phone]           Fax   [phone]  History of Present Illness: This pleasant 77 year old gentleman is seen for a four-month followup office visit.  He has a history of known ischemic heart disease.  He had a remote inferior wall myocardial infarction.  He had coronary artery bypass graft surgery following a myocardial infarction in 19.  History of congestive heart failure, secondary to combined systolic and diastolic congestive heart failure, and he was hospitalized in January 2004.  Severe exacerbation of acute on chronic combined systolic and diastolic heart failure.  He has not been expressing any exertional chest pain.  On his last nuclear stress test in 2005.  His ejection fraction was 42% with inferior wall akinesis.  He had a echocardiogram, November 2009 in our office, showing an ejection fraction of 35-40%.  Patient has a history of moderate to severe dementia.  His wife is his caregiver.  Current Outpatient Prescriptions  Medication Sig Dispense Refill  . aspirin 81 MG tablet Take 81 mg by mouth daily.        . brimonidine (ALPHAGAN P) 0.1 % SOLN Apply to eye 2 (two) times daily.        . Calcium Carbonate-Vitamin D (CALTRATE 600+D PO) Take by mouth daily.        . clopidogrel (PLAVIX) 75 MG tablet Take 1 tablet (75 mg total) by mouth daily.  90 tablet  3  . furosemide (LASIX) 40 MG tablet Take 40 mg by mouth daily.        Marland Kitchen ibuprofen (ADVIL,MOTRIN) 200 MG tablet Take 200 mg by mouth every 6 (six) hours as needed.        . latanoprost (XALATAN) 0.005 % ophthalmic solution 1 drop at bedtime.        . memantine (NAMENDA) 10 MG tablet Take 1 tablet (10 mg total) by mouth 2 (two) times daily.  180 tablet  3  . Multiple Vitamin (MULTIVITAMIN) tablet Take 1 tablet by mouth daily.        . nitroGLYCERIN (NITROSTAT) 0.4 MG SL tablet Place 0.4 mg under  the tongue every 5 (five) minutes as needed.        . simvastatin (ZOCOR) 20 MG tablet Take 20 mg by mouth at bedtime.        Marland Kitchen DISCONTD: memantine (NAMENDA) 10 MG tablet Take 1 tablet (10 mg total) by mouth 2 (two) times daily.  180 tablet  3    Allergies  Allergen Reactions  . Other     ZPAK    Patient Active Problem List  Diagnoses  . Chronic combined systolic and diastolic congestive heart failure  . Dyslipidemia  . Dementia  . Hx of CABG    History  Smoking status  . Former Smoker  . Quit date: 06/13/1983  Smokeless tobacco  . Never Used    History  Alcohol Use No    Family History  Problem Relation Age of Onset  . Heart disease Father     Review of Systems: Constitutional: no fever chills diaphoresis or fatigue or change in weight.  Head and neck: no hearing loss, no epistaxis, no photophobia or visual disturbance. Respiratory: No cough, shortness of breath or wheezing. Cardiovascular: No chest pain peripheral edema, palpitations. Gastrointestinal: No abdominal distention, no abdominal pain, no change in bowel habits hematochezia  or melena. Genitourinary: No dysuria, no frequency, no urgency, no nocturia. Musculoskeletal:No arthralgias, no back pain, no gait disturbance or myalgias. Neurological: No dizziness, no headaches, no numbness, no seizures, no syncope, no weakness, no tremors. Hematologic: No lymphadenopathy, no easy bruising. Psychiatric: No confusion, no hallucinations, no sleep disturbance.    Physical Exam: Filed Vitals:   03/28/11 1107  BP: 116/62  Pulse: 66   the general appearance reveals a well-developed, well-nourished, elderly gentleman in no distress.Pupils equal and reactive.   Extraocular Movements are full.  There is no scleral icterus.  The mouth and pharynx are normal.  The neck is supple.  The carotids reveal no bruits.  The jugular venous pressure is normal.  The thyroid is not enlarged.  There is no lymphadenopathy.  The chest  is clear to percussion and auscultation. There are no rales or rhonchi. Expansion of the chest is symmetrical.  The precordium is quiet.  The first heart sound is normal.  The second heart sound is physiologically split.  There is no murmur gallop rub or click.  There is no abnormal lift or heave.  It is obese and slightly distended.  It is nontender.  No hepatosplenomegaly.  Extremities reveal trace to 1+ pretibial and pedal edema.  No phlebitis or cellulitis.  Illogic reveals dementia, otherwise nonfocal.The skin is warm and dry.  There is no rash.    Assessment / Plan: Continue on present medication.  Work harder on weight loss.  Recheck in 4 months for office visit and lab work

## 2011-03-30 ENCOUNTER — Telehealth: Payer: Self-pay | Admitting: *Deleted

## 2011-03-30 NOTE — Telephone Encounter (Signed)
Message copied by Burnell Blanks on Thu Mar 30, 2011  9:35 AM ------      Message from: Cassell Clement      Created: Tue Mar 28, 2011  6:00 PM       Please report.  The cholesterol is good at 133.  Triglycerides are high at 176.  The liver and kidney tests are normal.  Continue same medication, but work harder on low carbohydrate diet.

## 2011-03-30 NOTE — Progress Notes (Signed)
Advised wife  

## 2011-03-30 NOTE — Telephone Encounter (Signed)
Advised wife of labs 

## 2011-05-10 ENCOUNTER — Other Ambulatory Visit: Payer: Self-pay | Admitting: *Deleted

## 2011-05-10 MED ORDER — SIMVASTATIN 20 MG PO TABS: 20.0000 mg | ORAL_TABLET | Freq: Every day | ORAL | Status: DC

## 2011-05-10 NOTE — Telephone Encounter (Signed)
Refilled simvastatin.

## 2011-05-12 ENCOUNTER — Other Ambulatory Visit: Payer: Self-pay | Admitting: *Deleted

## 2011-07-07 ENCOUNTER — Other Ambulatory Visit: Payer: Self-pay | Admitting: *Deleted

## 2011-08-01 ENCOUNTER — Other Ambulatory Visit: Payer: Medicare Other

## 2011-08-01 ENCOUNTER — Ambulatory Visit (INDEPENDENT_AMBULATORY_CARE_PROVIDER_SITE_OTHER): Payer: Medicare Other | Admitting: Cardiology

## 2011-08-01 ENCOUNTER — Encounter: Payer: Self-pay | Admitting: Cardiology

## 2011-08-01 VITALS — BP 130/66 | HR 70 | Resp 18 | Ht 66.0 in | Wt 187.8 lb

## 2011-08-01 DIAGNOSIS — E785 Hyperlipidemia, unspecified: Secondary | ICD-10-CM

## 2011-08-01 DIAGNOSIS — F0390 Unspecified dementia without behavioral disturbance: Secondary | ICD-10-CM

## 2011-08-01 DIAGNOSIS — E7800 Pure hypercholesterolemia, unspecified: Secondary | ICD-10-CM

## 2011-08-01 DIAGNOSIS — R413 Other amnesia: Secondary | ICD-10-CM

## 2011-08-01 DIAGNOSIS — Z951 Presence of aortocoronary bypass graft: Secondary | ICD-10-CM

## 2011-08-01 DIAGNOSIS — I509 Heart failure, unspecified: Secondary | ICD-10-CM

## 2011-08-01 DIAGNOSIS — I5042 Chronic combined systolic (congestive) and diastolic (congestive) heart failure: Secondary | ICD-10-CM

## 2011-08-01 LAB — BASIC METABOLIC PANEL
BUN: 20 mg/dL (ref 6–23)
CO2: 30 mEq/L (ref 19–32)
Chloride: 105 mEq/L (ref 96–112)
Creatinine, Ser: 1.5 mg/dL (ref 0.4–1.5)
Glucose, Bld: 105 mg/dL — ABNORMAL HIGH (ref 70–99)
Potassium: 4.1 mEq/L (ref 3.5–5.1)

## 2011-08-01 LAB — LIPID PANEL
Cholesterol: 132 mg/dL (ref 0–200)
LDL Cholesterol: 57 mg/dL (ref 0–99)
VLDL: 36.8 mg/dL (ref 0.0–40.0)

## 2011-08-01 LAB — HEPATIC FUNCTION PANEL
Alkaline Phosphatase: 44 U/L (ref 39–117)
Bilirubin, Direct: 0 mg/dL (ref 0.0–0.3)
Total Bilirubin: 0.7 mg/dL (ref 0.3–1.2)
Total Protein: 7.4 g/dL (ref 6.0–8.3)

## 2011-08-01 NOTE — Assessment & Plan Note (Signed)
The patient has moderate dementia.  He no longer drives a car.  He gets very little exercise and is sedentary at home.

## 2011-08-01 NOTE — Assessment & Plan Note (Signed)
The patient has a history of dyslipidemia and is on simvastatin 20 mg daily.  He is not having any myalgias from simvastatin.

## 2011-08-01 NOTE — Progress Notes (Signed)
[name] [name] Date of Birth:  [birth year] [facility] [address] [place], [place]  [address] [phone]         Fax   [phone]  History of Present Illness: This pleasant 78 year old gentleman is seen for a scheduled followup office visit.  He has a history of ischemic heart disease.  He has a history of a remote inferior wall myocardial infarction.  He has had coronary artery bypass graft surgery by Dr. Andrey Campanile in 1985.  He has a past history of congestive heart failure.  His last nuclear stress test in 2005 showed an ejection fraction of 42% with inferior wall akinesis.  An echocardiogram in 2009 showed an ejection fraction of 35-40%.  The patient also has a past history of dementia and a history of an old stroke.  Current Outpatient Prescriptions  Medication Sig Dispense Refill  . aspirin 81 MG tablet Take 81 mg by mouth daily.        . brimonidine (ALPHAGAN P) 0.1 % SOLN Apply to eye 3 (three) times daily.       . Calcium Carbonate-Vitamin D (CALTRATE 600+D PO) Take by mouth daily.        . clopidogrel (PLAVIX) 75 MG tablet Take 1 tablet (75 mg total) by mouth daily.  90 tablet  3  . furosemide (LASIX) 40 MG tablet Take 40 mg by mouth daily.        Marland Kitchen ibuprofen (ADVIL,MOTRIN) 200 MG tablet Take 200 mg by mouth every 6 (six) hours as needed.        . latanoprost (XALATAN) 0.005 % ophthalmic solution 1 drop at bedtime.        . memantine (NAMENDA) 10 MG tablet Take 1 tablet (10 mg total) by mouth 2 (two) times daily.  180 tablet  3  . Multiple Vitamin (MULTIVITAMIN) tablet Take 1 tablet by mouth daily.        . nitroGLYCERIN (NITROSTAT) 0.4 MG SL tablet Place 0.4 mg under the tongue every 5 (five) minutes as needed.        . simvastatin (ZOCOR) 20 MG tablet Take 1 tablet (20 mg total) by mouth at bedtime.  90 tablet  3    Allergies  Allergen Reactions  . Other     ZPAK    Patient Active Problem List  Diagnoses  . Chronic combined systolic and diastolic  congestive heart failure  . Dyslipidemia  . Dementia  . Hx of CABG    History  Smoking status  . Former Smoker  . Quit date: 06/13/1983  Smokeless tobacco  . Never Used    History  Alcohol Use No    Family History  Problem Relation Age of Onset  . Heart disease Father     Review of Systems: Constitutional: no fever chills diaphoresis or fatigue or change in weight.  Head and neck: no hearing loss, no epistaxis, no photophobia or visual disturbance. Respiratory: No cough, shortness of breath or wheezing. Cardiovascular: No chest pain peripheral edema, palpitations. Gastrointestinal: No abdominal distention, no abdominal pain, no change in bowel habits hematochezia or melena. Genitourinary: No dysuria, no frequency, no urgency, no nocturia. Musculoskeletal:No arthralgias, no back pain, no gait disturbance or myalgias. Neurological: No dizziness, no headaches, no numbness, no seizures, no syncope, no weakness, no tremors. Hematologic: No lymphadenopathy, no easy bruising. Psychiatric: No confusion, no hallucinations, no sleep disturbance.    Physical Exam: Filed Vitals:   08/01/11 1024  BP: 130/66  Pulse: 70  Resp:  18   The general appearance reveals a somewhat disheveled elderly gentleman in no acute distress.Pupils equal and reactive.   Extraocular Movements are full.  There is no scleral icterus.  The mouth and pharynx are normal.  The neck is supple.  The carotids reveal no bruits.  The jugular venous pressure is normal.  The thyroid is not enlarged.  There is no lymphadenopathy.  The chest is clear to percussion and auscultation. There are no rales or rhonchi. Expansion of the chest is symmetrical.  The precordium is quiet.  The first heart sound is normal.  The second heart sound is physiologically split.  There is no murmur gallop rub or click.  There is no abnormal lift or heave.  The abdomen is soft and nontender. Bowel sounds are normal. The liver and spleen  are not enlarged. There Are no abdominal masses. There are no bruits.  The pedal pulses are good.  There is no phlebitis or edema.  There is no cyanosis or clubbing. Neurologic exam reveals dementia with decreased recent memory.The skin is warm and dry.  There is no rash.   Assessment / Plan: Continue same medication.  Recheck in 4 months for followup office visit EKG lipid panel hepatic function panel and basal metabolic panel.  He needs to cut back on sweets and candies and he needs to lose weight.

## 2011-08-01 NOTE — Patient Instructions (Signed)
Will obtain labs today and call you with the results   Your physician recommends that you continue on your current medications as directed. Please refer to the Current Medication list given to you today.  Your physician recommends that you schedule a follow-up appointment in: 4 months with fasting labs (lp/bmet/hfp) and EKG 

## 2011-08-01 NOTE — Progress Notes (Signed)
Quick Note:  Please report to patient. The recent labs are stable. Continue same medication and careful diet. ______ 

## 2011-08-01 NOTE — Assessment & Plan Note (Signed)
The patient has not been experiencing any orthopnea or paroxysmal nocturnal dyspnea.  His weight is up 6 pounds and his abdomen appears to be more full.  He is not having any peripheral edema.

## 2011-08-01 NOTE — Assessment & Plan Note (Signed)
The patient has not had any recurrent chest pain or angina.  He has not been aware of any palpitations.  He said no dizziness or syncope.

## 2011-08-10 ENCOUNTER — Telehealth: Payer: Self-pay | Admitting: *Deleted

## 2011-08-10 NOTE — Telephone Encounter (Signed)
Message copied by Burnell Blanks on Thu Aug 10, 2011 11:56 AM ------      Message from: Cassell Clement      Created: Tue Aug 01, 2011  2:11 PM       Please report to patient.  The recent labs are stable. Continue same medication and careful diet.

## 2011-08-10 NOTE — Telephone Encounter (Signed)
Advised wife  

## 2011-12-05 ENCOUNTER — Encounter: Payer: Self-pay | Admitting: Cardiology

## 2011-12-05 ENCOUNTER — Ambulatory Visit (INDEPENDENT_AMBULATORY_CARE_PROVIDER_SITE_OTHER): Payer: Medicare Other | Admitting: Cardiology

## 2011-12-05 ENCOUNTER — Other Ambulatory Visit (INDEPENDENT_AMBULATORY_CARE_PROVIDER_SITE_OTHER): Payer: Medicare Other

## 2011-12-05 ENCOUNTER — Other Ambulatory Visit: Payer: Self-pay

## 2011-12-05 VITALS — BP 122/74 | HR 64 | Resp 16 | Ht 68.0 in | Wt 185.8 lb

## 2011-12-05 DIAGNOSIS — I5022 Chronic systolic (congestive) heart failure: Secondary | ICD-10-CM

## 2011-12-05 DIAGNOSIS — I2510 Atherosclerotic heart disease of native coronary artery without angina pectoris: Secondary | ICD-10-CM

## 2011-12-05 DIAGNOSIS — R0989 Other specified symptoms and signs involving the circulatory and respiratory systems: Secondary | ICD-10-CM

## 2011-12-05 DIAGNOSIS — E785 Hyperlipidemia, unspecified: Secondary | ICD-10-CM

## 2011-12-05 DIAGNOSIS — I5042 Chronic combined systolic (congestive) and diastolic (congestive) heart failure: Secondary | ICD-10-CM

## 2011-12-05 DIAGNOSIS — I509 Heart failure, unspecified: Secondary | ICD-10-CM

## 2011-12-05 DIAGNOSIS — Z951 Presence of aortocoronary bypass graft: Secondary | ICD-10-CM

## 2011-12-05 LAB — BASIC METABOLIC PANEL
BUN: 22 mg/dL (ref 6–23)
CO2: 30 mEq/L (ref 19–32)
Chloride: 104 mEq/L (ref 96–112)
Creatinine, Ser: 1.6 mg/dL — ABNORMAL HIGH (ref 0.4–1.5)
Potassium: 4.4 mEq/L (ref 3.5–5.1)

## 2011-12-05 LAB — HEPATIC FUNCTION PANEL
ALT: 16 U/L (ref 0–53)
AST: 25 U/L (ref 0–37)
Albumin: 3.9 g/dL (ref 3.5–5.2)
Total Bilirubin: 0.8 mg/dL (ref 0.3–1.2)
Total Protein: 7.4 g/dL (ref 6.0–8.3)

## 2011-12-05 LAB — LIPID PANEL
Cholesterol: 107 mg/dL (ref 0–200)
LDL Cholesterol: 34 mg/dL (ref 0–99)
Triglycerides: 183 mg/dL — ABNORMAL HIGH (ref 0.0–149.0)

## 2011-12-05 MED ORDER — FUROSEMIDE 20 MG PO TABS: 20.0000 mg | ORAL_TABLET | Freq: Every day | ORAL | Status: DC

## 2011-12-05 NOTE — Patient Instructions (Addendum)
Your physician wants you to follow-up in: 4 months.  You will receive a reminder letter in the mail two months in advance. If you don't receive a letter, please call our office to schedule the follow-up appointment.  Your physician recommends that you return for lab work in: today and in 4 months.   Your physician recommends that you continue on your current medications as directed. Please refer to the Current Medication list given to you today.

## 2011-12-05 NOTE — Assessment & Plan Note (Signed)
The patient has not been experiencing any paroxysmal nocturnal dyspnea.  He sleeps well and does not have any nocturia.  He sleeps on 2 pillows.  His exercise consists of walking to the mailbox daily and walking to the curb to get his newspaper daily.  He is sedentary.  He sleeps until about noontime most days

## 2011-12-05 NOTE — Progress Notes (Signed)
[name] [name] Date of Birth:  Jun 28, 1924 [facility] [address] [place], [place]  [address] [phone]         Fax   [phone]  History of Present Illness: This pleasant 78 year old gentleman is seen for a four-month followup office visit.  He has a history of ischemic heart disease.  He has a history of a remote inferior wall myocardial infarction.  In 1985 Dr. Andrey Campanile performed coronary artery bypass graft surgery.  Patient has a past history of acute on chronic systolic congestive heart failure.  His left ventricular ejection fractions have range in the 35-40% range by echocardiogram in 2009.  The patient has a prior history of a previous stroke for which he was hospitalized on the neurology service.  He has had a history of progressive dementia.  Current Outpatient Prescriptions  Medication Sig Dispense Refill  . aspirin 81 MG tablet Take 81 mg by mouth daily.        . brimonidine (ALPHAGAN P) 0.1 % SOLN Apply to eye 3 (three) times daily.       . Calcium Carbonate-Vitamin D (CALTRATE 600+D PO) Take by mouth daily.        . clopidogrel (PLAVIX) 75 MG tablet Take 1 tablet (75 mg total) by mouth daily.  90 tablet  3  . furosemide (LASIX) 40 MG tablet Take 40 mg by mouth daily.        Marland Kitchen ibuprofen (ADVIL,MOTRIN) 200 MG tablet Take 200 mg by mouth every 6 (six) hours as needed.        . latanoprost (XALATAN) 0.005 % ophthalmic solution 1 drop at bedtime.        . memantine (NAMENDA) 10 MG tablet Take 1 tablet (10 mg total) by mouth 2 (two) times daily.  180 tablet  3  . Multiple Vitamin (MULTIVITAMIN) tablet Take 1 tablet by mouth daily.        . nitroGLYCERIN (NITROSTAT) 0.4 MG SL tablet Place 0.4 mg under the tongue every 5 (five) minutes as needed.        . simvastatin (ZOCOR) 20 MG tablet Take 1 tablet (20 mg total) by mouth at bedtime.  90 tablet  3    Allergies  Allergen Reactions  . Other     ZPAK    Patient Active Problem List  Diagnosis  .  Chronic combined systolic and diastolic congestive heart failure  . Dyslipidemia  . Dementia  . Hx of CABG    History  Smoking status  . Former Smoker  . Quit date: 06/13/1983  Smokeless tobacco  . Never Used    History  Alcohol Use No    Family History  Problem Relation Age of Onset  . Heart disease Father     Review of Systems: Constitutional: no fever chills diaphoresis or fatigue or change in weight.  Head and neck: no hearing loss, no epistaxis, no photophobia or visual disturbance. Respiratory: No cough, shortness of breath or wheezing. Cardiovascular: No chest pain peripheral edema, palpitations. Gastrointestinal: No abdominal distention, no abdominal pain, no change in bowel habits hematochezia or melena. Genitourinary: No dysuria, no frequency, no urgency, no nocturia. Musculoskeletal:No arthralgias, no back pain, no gait disturbance or myalgias. Neurological: No dizziness, no headaches, no numbness, no seizures, no syncope, no weakness, no tremors. Hematologic: No lymphadenopathy, no easy bruising. Psychiatric: No confusion, no hallucinations, no sleep disturbance.    Physical Exam: Filed Vitals:   12/05/11 0953  BP: 122/74  Pulse: 64  Resp:  16   the general appearance reveals a pleasant mildly demented gentleman in no acute distress.The head and neck exam reveals pupils equal and reactive.  Extraocular movements are full.  There is no scleral icterus.  The mouth and pharynx are normal.  The neck is supple.  The carotids reveal no bruits.  The jugular venous pressure is normal.  The  thyroid is not enlarged.  There is no lymphadenopathy.  The chest is clear to percussion and auscultation.  There are no rales or rhonchi.  Expansion of the chest is symmetrical.  The precordium is quiet.  The first heart sound is normal.  The second heart sound is physiologically split.  There is no murmur gallop rub or click.  There is no abnormal lift or heave.  The abdomen is soft  and nontender.  The bowel sounds are normal.  The liver and spleen are not enlarged.  There are no abdominal masses.  There are no abdominal bruits.  Extremities reveal good pedal pulses.  There is no phlebitis but there is trace ankle edema There is no cyanosis or clubbing.  Strength is normal and symmetrical in all extremities.  There is no lateralizing weakness.  There are no sensory deficits.  The skin is warm and dry.  There is no rash.     Assessment / Plan: Continue same medication.  Try to work harder on careful diet and a heart healthy diet.  He does eat a lot of sweets.  Recheck in 4 months for office visit and fasting lab work

## 2011-12-05 NOTE — Assessment & Plan Note (Signed)
The patient denies any chest pain or angina pectoris. 

## 2011-12-05 NOTE — Assessment & Plan Note (Signed)
The patient has a history of dyslipidemia and is on simvastatin 20 mg daily.  He has not been having any myalgias or side effects.

## 2011-12-05 NOTE — Progress Notes (Signed)
Quick Note:  Please report to patient. The recent labs are stable. Continue same medication and careful diet. Kidneys are getting drier. Reduce lasix to just half tablet daily = 20mg  dqily. ______

## 2011-12-18 ENCOUNTER — Other Ambulatory Visit: Payer: Self-pay | Admitting: *Deleted

## 2011-12-18 MED ORDER — DONEPEZIL HCL 10 MG PO TABS: 10.0000 mg | ORAL_TABLET | Freq: Every evening | ORAL | Status: DC | PRN

## 2011-12-18 NOTE — Telephone Encounter (Signed)
Refilled donepezil

## 2012-01-15 ENCOUNTER — Other Ambulatory Visit: Payer: Self-pay | Admitting: *Deleted

## 2012-01-15 MED ORDER — CLOPIDOGREL BISULFATE 75 MG PO TABS: 75.0000 mg | ORAL_TABLET | Freq: Every day | ORAL | Status: DC

## 2012-01-15 NOTE — Telephone Encounter (Signed)
Fax Received. Refill Completed. [name] (R.M.A)   

## 2012-01-17 ENCOUNTER — Other Ambulatory Visit: Payer: Self-pay | Admitting: *Deleted

## 2012-01-17 MED ORDER — CLOPIDOGREL BISULFATE 75 MG PO TABS: 75.0000 mg | ORAL_TABLET | Freq: Every day | ORAL | Status: DC

## 2012-01-17 NOTE — Telephone Encounter (Signed)
Refilled clopidogrel.

## 2012-02-29 ENCOUNTER — Telehealth: Payer: Self-pay | Admitting: Cardiology

## 2012-02-29 NOTE — Telephone Encounter (Signed)
PT WANTS AN AUTH FROM DR BRACKBILL TO GET A FORM TO APPLY FOR A HANDICAPP STICKER, TOLD HER SHE CAN GO TO [employer] AND GET  ONE THEN BRING IT HERE, BUT SHE SAID MELINDA HAS TO GIVE HER AN AUTH TO GET THE FORM, I TOLD HER I WOULD HAVE MELINDA CALL HER

## 2012-02-29 NOTE — Telephone Encounter (Signed)
Filled out and will mail to patient next week after  Dr. Patty Sermons signs

## 2012-04-16 ENCOUNTER — Encounter: Payer: Self-pay | Admitting: Cardiology

## 2012-04-16 ENCOUNTER — Ambulatory Visit (INDEPENDENT_AMBULATORY_CARE_PROVIDER_SITE_OTHER): Payer: Medicare Other | Admitting: Cardiology

## 2012-04-16 ENCOUNTER — Other Ambulatory Visit (INDEPENDENT_AMBULATORY_CARE_PROVIDER_SITE_OTHER): Payer: Medicare Other

## 2012-04-16 VITALS — BP 124/67 | HR 67 | Resp 18 | Ht 69.6 in | Wt 185.0 lb

## 2012-04-16 DIAGNOSIS — I5042 Chronic combined systolic (congestive) and diastolic (congestive) heart failure: Secondary | ICD-10-CM

## 2012-04-16 DIAGNOSIS — I509 Heart failure, unspecified: Secondary | ICD-10-CM

## 2012-04-16 DIAGNOSIS — E785 Hyperlipidemia, unspecified: Secondary | ICD-10-CM

## 2012-04-16 DIAGNOSIS — Z951 Presence of aortocoronary bypass graft: Secondary | ICD-10-CM

## 2012-04-16 DIAGNOSIS — I259 Chronic ischemic heart disease, unspecified: Secondary | ICD-10-CM

## 2012-04-16 DIAGNOSIS — F0390 Unspecified dementia without behavioral disturbance: Secondary | ICD-10-CM

## 2012-04-16 LAB — BASIC METABOLIC PANEL
BUN: 17 mg/dL (ref 6–23)
CO2: 29 mEq/L (ref 19–32)
Chloride: 104 mEq/L (ref 96–112)
Potassium: 3.6 mEq/L (ref 3.5–5.1)

## 2012-04-16 LAB — HEPATIC FUNCTION PANEL
Albumin: 4.1 g/dL (ref 3.5–5.2)
Alkaline Phosphatase: 44 U/L (ref 39–117)
Bilirubin, Direct: 0.2 mg/dL (ref 0.0–0.3)
Total Protein: 7.5 g/dL (ref 6.0–8.3)

## 2012-04-16 LAB — LIPID PANEL
Cholesterol: 132 mg/dL (ref 0–200)
LDL Cholesterol: 62 mg/dL (ref 0–99)
Total CHOL/HDL Ratio: 4
VLDL: 35.4 mg/dL (ref 0.0–40.0)

## 2012-04-16 NOTE — Progress Notes (Signed)
Quick Note:  Please report to patient. The recent labs are stable. Continue same medication and careful diet. ______ 

## 2012-04-16 NOTE — Assessment & Plan Note (Signed)
The patient does not follow a careful diet

## 2012-04-16 NOTE — Patient Instructions (Addendum)
Will obtain labs today and call you with the results (lp/bmet/hfp)  Your physician recommends that you continue on your current medications as directed. Please refer to the Current Medication list given to you today.  Your physician recommends that you schedule a follow-up appointment in: 4 months with fasting labs (lp/bmet/hfp)  

## 2012-04-16 NOTE — Assessment & Plan Note (Signed)
The patient's weight is stable.  He has not been having any worsening of his dyspnea or fluid retention.

## 2012-04-16 NOTE — Assessment & Plan Note (Signed)
His dementia appears to be stable or perhaps slightly worse.  He is on both Aricept and Namenda and appears to be tolerating without side effects

## 2012-04-16 NOTE — Assessment & Plan Note (Signed)
The patient is sedentary.  His main activity is walking to the mailbox once a day and walking out in the morning to get his newspaper once a day.  He denies any exertional chest pain or recurrent angina pectoris.

## 2012-04-16 NOTE — Progress Notes (Signed)
[name] [name] Date of Birth:  [birth year] [facility] [address] [place], [place]  [address] [phone]         Fax   [phone]  History of Present Illness: This pleasant 78 year old gentleman is seen for a four-month followup office visit. He has a history of ischemic heart disease. He has a history of a remote inferior wall myocardial infarction. In 1985 Dr. Andrey Campanile performed coronary artery bypass graft surgery. Patient has a past history of acute on chronic systolic congestive heart failure. His left ventricular ejection fractions have range in the 35-40% range by echocardiogram in 2009. The patient has a prior history of a previous stroke for which he was hospitalized on the neurology service. He has had a history of progressive dementia.   Current Outpatient Prescriptions  Medication Sig Dispense Refill  . aspirin 81 MG tablet Take 81 mg by mouth daily.        . brimonidine (ALPHAGAN P) 0.1 % SOLN Apply to eye 3 (three) times daily.       . Calcium Carbonate-Vitamin D (CALTRATE 600+D PO) Take by mouth daily.        . clopidogrel (PLAVIX) 75 MG tablet Take 1 tablet (75 mg total) by mouth daily.  90 tablet  3  . donepezil (ARICEPT) 10 MG tablet Take 1 tablet (10 mg total) by mouth at bedtime as needed.  90 tablet  3  . furosemide (LASIX) 20 MG tablet Take 1 tablet (20 mg total) by mouth daily.  30 tablet  5  . ibuprofen (ADVIL,MOTRIN) 200 MG tablet Take 200 mg by mouth every 6 (six) hours as needed.        . latanoprost (XALATAN) 0.005 % ophthalmic solution 1 drop at bedtime.        . memantine (NAMENDA) 10 MG tablet Take 10 mg by mouth 2 (two) times daily.      . Multiple Vitamin (MULTIVITAMIN) tablet Take 1 tablet by mouth daily.        . nitroGLYCERIN (NITROSTAT) 0.4 MG SL tablet Place 0.4 mg under the tongue every 5 (five) minutes as needed.        . simvastatin (ZOCOR) 20 MG tablet Take 1 tablet (20 mg total) by mouth at bedtime.  90 tablet  3  .  [DISCONTINUED] memantine (NAMENDA) 10 MG tablet Take 1 tablet (10 mg total) by mouth 2 (two) times daily.  180 tablet  3    Allergies  Allergen Reactions  . Other     ZPAK    Patient Active Problem List  Diagnosis  . Chronic combined systolic and diastolic congestive heart failure  . Dyslipidemia  . Dementia  . Hx of CABG    History  Smoking status  . Former Smoker  . Quit date: 06/13/1983  Smokeless tobacco  . Never Used    History  Alcohol Use No    Family History  Problem Relation Age of Onset  . Heart disease Father     Review of Systems: Constitutional: no fever chills diaphoresis or fatigue or change in weight.  Head and neck: no hearing loss, no epistaxis, no photophobia or visual disturbance. Respiratory: No cough, shortness of breath or wheezing. Cardiovascular: No chest pain peripheral edema, palpitations. Gastrointestinal: No abdominal distention, no abdominal pain, no change in bowel habits hematochezia or melena. Genitourinary: No dysuria, no frequency, no urgency, no nocturia. Musculoskeletal:No arthralgias, no back pain, no gait disturbance or myalgias. Neurological: No dizziness, no headaches, no numbness,  no seizures, no syncope, no weakness, no tremors. Hematologic: No lymphadenopathy, no easy bruising. Psychiatric: No confusion, no hallucinations, no sleep disturbance.    Physical Exam: Filed Vitals:   04/16/12 1126  BP: 124/67  Pulse: 67  Resp: 18   this is an elderly gentleman who appears to be somewhat disheveled.  He is pleasant and alert.The head and neck exam reveals pupils equal and reactive.  Extraocular movements are full.  There is no scleral icterus.  The mouth and pharynx are normal.  The neck is supple.  The carotids reveal no bruits.  The jugular venous pressure is normal.  The  thyroid is not enlarged.  There is no lymphadenopathy.  The chest is clear to percussion and auscultation.  There are no rales or rhonchi.  Expansion of  the chest is symmetrical.  The precordium is quiet.  The first heart sound is normal.  The second heart sound is physiologically split.  There is no murmur gallop rub or click.  There is no abnormal lift or heave.  The abdomen is soft and nontender.  The bowel sounds are normal.  The liver and spleen are not enlarged.  There are no abdominal masses.  There are no abdominal bruits.  Extremities reveal good pedal pulses.  There is no phlebitis or edema.  There is no cyanosis or clubbing.  Strength is normal and symmetrical in all extremities.  There is no lateralizing weakness.  There are no sensory deficits.  The skin is warm and dry.  There is no rash.     Assessment / Plan: Continue same medication.  Recheck in 4 months for followup office visit lipid panel hepatic function panel and basal metabolic panel.

## 2012-04-17 ENCOUNTER — Telehealth: Payer: Self-pay | Admitting: *Deleted

## 2012-04-17 NOTE — Telephone Encounter (Signed)
Advised wife of labs 

## 2012-04-17 NOTE — Telephone Encounter (Signed)
Message copied by Burnell Blanks on Wed Apr 17, 2012  3:38 PM ------      Message from: Cassell Clement      Created: Tue Apr 16, 2012  9:07 PM       Please report to patient.  The recent labs are stable. Continue same medication and careful diet.

## 2012-04-29 ENCOUNTER — Other Ambulatory Visit: Payer: Self-pay | Admitting: *Deleted

## 2012-04-29 MED ORDER — MEMANTINE HCL 10 MG PO TABS: 10.0000 mg | ORAL_TABLET | Freq: Two times a day (BID) | ORAL | Status: DC

## 2012-05-24 ENCOUNTER — Other Ambulatory Visit: Payer: Self-pay

## 2012-05-24 MED ORDER — SIMVASTATIN 20 MG PO TABS: 20.0000 mg | ORAL_TABLET | Freq: Every day | ORAL | Status: DC

## 2012-07-17 ENCOUNTER — Other Ambulatory Visit: Payer: Self-pay | Admitting: *Deleted

## 2012-07-17 ENCOUNTER — Other Ambulatory Visit (INDEPENDENT_AMBULATORY_CARE_PROVIDER_SITE_OTHER): Payer: Medicare Other

## 2012-07-17 DIAGNOSIS — E785 Hyperlipidemia, unspecified: Secondary | ICD-10-CM

## 2012-07-17 LAB — HEPATIC FUNCTION PANEL
Albumin: 4.1 g/dL (ref 3.5–5.2)
Total Protein: 7.5 g/dL (ref 6.0–8.3)

## 2012-07-17 LAB — BASIC METABOLIC PANEL
CO2: 28 mEq/L (ref 19–32)
Chloride: 106 mEq/L (ref 96–112)
Potassium: 3.7 mEq/L (ref 3.5–5.1)

## 2012-07-17 LAB — LIPID PANEL
Cholesterol: 122 mg/dL (ref 0–200)
LDL Cholesterol: 55 mg/dL (ref 0–99)
Total CHOL/HDL Ratio: 3
Triglycerides: 154 mg/dL — ABNORMAL HIGH (ref 0.0–149.0)
VLDL: 30.8 mg/dL (ref 0.0–40.0)

## 2012-07-17 NOTE — Progress Notes (Signed)
Quick Note:  Please report to patient. The recent labs are stable. Continue same medication and careful diet. BS 101. ______

## 2012-08-14 ENCOUNTER — Other Ambulatory Visit: Payer: Medicare Other

## 2012-08-14 ENCOUNTER — Ambulatory Visit (INDEPENDENT_AMBULATORY_CARE_PROVIDER_SITE_OTHER): Payer: Medicare Other | Admitting: Cardiology

## 2012-08-14 ENCOUNTER — Ambulatory Visit: Payer: Medicare Other | Admitting: Cardiology

## 2012-08-14 ENCOUNTER — Encounter: Payer: Self-pay | Admitting: Cardiology

## 2012-08-14 VITALS — BP 110/78 | HR 56 | Ht 68.0 in | Wt 200.0 lb

## 2012-08-14 DIAGNOSIS — I5042 Chronic combined systolic (congestive) and diastolic (congestive) heart failure: Secondary | ICD-10-CM

## 2012-08-14 NOTE — Patient Instructions (Addendum)
Your physician wants you to follow-up in: 4 months with ekg  You will receive a reminder letter in the mail two months in advance. If you don't receive a letter, please call our office to schedule the follow-up appointment.  Your physician recommends that you continue on your current medications as directed. Please refer to the Current Medication list given to you today.  Your physician recommends that you return for a FASTING lipid profile: 4 months

## 2012-08-14 NOTE — Assessment & Plan Note (Signed)
His weight is up 15 pounds.  This appears to be flushed rather than fluid.  He is not having any peripheral edema orthopnea or paroxysmal nocturnal dyspnea or increased exertional dyspnea.

## 2012-08-14 NOTE — Progress Notes (Signed)
[name] [name] Date of Birth:  1924/12/06 [facility] [address] [place], [place]  [address] [phone]         Fax   [phone]  History of Present Illness: This pleasant 79 year old gentleman is seen for a four-month followup office visit. He has a history of ischemic heart disease. He has a history of a remote inferior wall myocardial infarction. In 1985 Dr. Andrey Campanile performed coronary artery bypass graft surgery. Patient has a past history of acute on chronic systolic congestive heart failure. His left ventricular ejection fractions have range in the 35-40% range by echocardiogram in 2009. The patient has a prior history of a previous stroke for which he was hospitalized on the neurology service. He has had a history of progressive dementia.  Since last visit he has had no new cardiac symptoms but his weight is up 15 pounds.   Current Outpatient Prescriptions  Medication Sig Dispense Refill  . aspirin 81 MG tablet Take 81 mg by mouth daily.        . brimonidine (ALPHAGAN P) 0.1 % SOLN Apply to eye 3 (three) times daily.       . Calcium Carbonate-Vitamin D (CALTRATE 600+D PO) Take by mouth daily.        . clopidogrel (PLAVIX) 75 MG tablet Take 1 tablet (75 mg total) by mouth daily.  90 tablet  3  . donepezil (ARICEPT) 10 MG tablet Take 1 tablet (10 mg total) by mouth at bedtime as needed.  90 tablet  3  . furosemide (LASIX) 20 MG tablet Take 1 tablet (20 mg total) by mouth daily.  30 tablet  5  . ibuprofen (ADVIL,MOTRIN) 200 MG tablet Take 200 mg by mouth every 6 (six) hours as needed.        . latanoprost (XALATAN) 0.005 % ophthalmic solution 1 drop at bedtime.        . memantine (NAMENDA) 10 MG tablet Take 1 tablet (10 mg total) by mouth 2 (two) times daily.  180 tablet  3  . Multiple Vitamin (MULTIVITAMIN) tablet Take 1 tablet by mouth daily.        . nitroGLYCERIN (NITROSTAT) 0.4 MG SL tablet Place 0.4 mg under the tongue every 5 (five) minutes as needed.         . simvastatin (ZOCOR) 20 MG tablet Take 1 tablet (20 mg total) by mouth at bedtime.  90 tablet  3   No current facility-administered medications for this visit.    Allergies  Allergen Reactions  . Other     ZPAK    Patient Active Problem List  Diagnosis  . Chronic combined systolic and diastolic congestive heart failure  . Dyslipidemia  . Dementia  . Hx of CABG    History  Smoking status  . Former Smoker  . Quit date: 06/13/1983  Smokeless tobacco  . Never Used    History  Alcohol Use No    Family History  Problem Relation Age of Onset  . Heart disease Father     Review of Systems: Constitutional: no fever chills diaphoresis or fatigue or change in weight.  Head and neck: no hearing loss, no epistaxis, no photophobia or visual disturbance. Respiratory: No cough, shortness of breath or wheezing. Cardiovascular: No chest pain peripheral edema, palpitations. Gastrointestinal: No abdominal distention, no abdominal pain, no change in bowel habits hematochezia or melena. Genitourinary: No dysuria, no frequency, no urgency, no nocturia. Musculoskeletal:No arthralgias, no back pain, no gait disturbance or myalgias. Neurological:  No dizziness, no headaches, no numbness, no seizures, no syncope, no weakness, no tremors. Hematologic: No lymphadenopathy, no easy bruising. Psychiatric: No confusion, no hallucinations, no sleep disturbance.    Physical Exam: Filed Vitals:   08/14/12 1401  BP: 110/78  Pulse: 56   the general appearance reveals a well-developed well-nourished mildly obese gentleman in no distress.  He has moderate dementia but is pleasant.The head and neck exam reveals pupils equal and reactive.  Extraocular movements are full.  There is no scleral icterus.  The mouth and pharynx are normal.  The neck is supple.  The carotids reveal no bruits.  The jugular venous pressure is normal.  The  thyroid is not enlarged.  There is no lymphadenopathy.  The chest is  clear to percussion and auscultation.  There are no rales or rhonchi.  Expansion of the chest is symmetrical.  The precordium is quiet.  The first heart sound is normal.  The second heart sound is physiologically split.  There is no murmur gallop rub or click.  There is no abnormal lift or heave.  The abdomen is soft and nontender.  The bowel sounds are normal.  The liver and spleen are not enlarged.  There are no abdominal masses.  There are no abdominal bruits.  Extremities reveal good pedal pulses.  There is no phlebitis or edema.  There is no cyanosis or clubbing.  Strength is normal and symmetrical in all extremities.  There is no lateralizing weakness.  There are no sensory deficits.  The skin is warm and dry.  There is no rash.     Assessment / Plan: There is no evidence of increased fluid retention.  His increased abdominal girth appears to be adipose tissue rather than fluid.  Continue same medication.  Recheck in 4 months for followup office visit EKG lipid panel hepatic function panel and basal metabolic panel.  Cut back on calories to lose weight.

## 2012-08-14 NOTE — Assessment & Plan Note (Signed)
The patient remains on simvastatin 20 mg daily.  He has not been having any myalgias or side effects from the simvastatin

## 2012-08-14 NOTE — Assessment & Plan Note (Signed)
The patient has not been expressing any chest pain or angina. 

## 2012-10-03 ENCOUNTER — Telehealth: Payer: Self-pay | Admitting: Cardiology

## 2012-10-03 MED ORDER — FUROSEMIDE 20 MG PO TABS: 20.0000 mg | ORAL_TABLET | Freq: Every day | ORAL | Status: DC

## 2012-10-24 ENCOUNTER — Other Ambulatory Visit: Payer: Self-pay | Admitting: Dermatology

## 2012-12-05 ENCOUNTER — Other Ambulatory Visit: Payer: Self-pay | Admitting: Dermatology

## 2012-12-17 ENCOUNTER — Encounter: Payer: Self-pay | Admitting: Cardiology

## 2012-12-17 ENCOUNTER — Ambulatory Visit (INDEPENDENT_AMBULATORY_CARE_PROVIDER_SITE_OTHER): Payer: Medicare Other | Admitting: Cardiology

## 2012-12-17 VITALS — BP 124/72 | HR 54 | Ht 68.0 in | Wt 181.0 lb

## 2012-12-17 DIAGNOSIS — I509 Heart failure, unspecified: Secondary | ICD-10-CM

## 2012-12-17 DIAGNOSIS — E785 Hyperlipidemia, unspecified: Secondary | ICD-10-CM

## 2012-12-17 DIAGNOSIS — I5042 Chronic combined systolic (congestive) and diastolic (congestive) heart failure: Secondary | ICD-10-CM

## 2012-12-17 DIAGNOSIS — Z951 Presence of aortocoronary bypass graft: Secondary | ICD-10-CM

## 2012-12-17 LAB — BASIC METABOLIC PANEL
Chloride: 108 mEq/L (ref 96–112)
Creatinine, Ser: 1.6 mg/dL — ABNORMAL HIGH (ref 0.4–1.5)
Potassium: 4.7 mEq/L (ref 3.5–5.1)
Sodium: 143 mEq/L (ref 135–145)

## 2012-12-17 LAB — LIPID PANEL
Cholesterol: 116 mg/dL (ref 0–200)
HDL: 38.2 mg/dL — ABNORMAL LOW (ref >39.00)
LDL Cholesterol: 47 mg/dL (ref 0–99)
Triglycerides: 156 mg/dL — ABNORMAL HIGH (ref 0.0–149.0)
VLDL: 31.2 mg/dL (ref 0.0–40.0)

## 2012-12-17 LAB — HEPATIC FUNCTION PANEL
Albumin: 4.1 g/dL (ref 3.5–5.2)
Total Protein: 7.6 g/dL (ref 6.0–8.3)

## 2012-12-17 MED ORDER — FUROSEMIDE 20 MG PO TABS: 20.0000 mg | ORAL_TABLET | Freq: Every day | ORAL | Status: DC

## 2012-12-17 NOTE — Progress Notes (Signed)
[name] [name] Date of Birth:  1924/12/15 [facility] [address] [place], [place]  [address] [phone]         Fax   [phone]  History of Present Illness: This pleasant 79 year old gentleman is seen for a four-month followup office visit. He has a history of ischemic heart disease. He has a history of a remote inferior wall myocardial infarction. In 1985 Dr. Andrey Campanile performed coronary artery bypass graft surgery. Patient has a past history of acute on chronic systolic congestive heart failure. His left ventricular ejection fractions have range in the 35-40% range by echocardiogram in 2009. The patient has a prior history of a previous stroke for which he was hospitalized on the neurology service. He has had a history of progressive dementia. Since last visit he has had no new cardiac symptoms .   Current Outpatient Prescriptions  Medication Sig Dispense Refill  . aspirin 81 MG tablet Take 81 mg by mouth daily.        . brimonidine (ALPHAGAN P) 0.1 % SOLN Apply to eye 3 (three) times daily.       . Calcium Carbonate-Vitamin D (CALTRATE 600+D PO) Take by mouth daily.        . clopidogrel (PLAVIX) 75 MG tablet Take 1 tablet (75 mg total) by mouth daily.  90 tablet  3  . donepezil (ARICEPT) 10 MG tablet Take 1 tablet (10 mg total) by mouth at bedtime as needed.  90 tablet  3  . furosemide (LASIX) 20 MG tablet Take 1 tablet (20 mg total) by mouth daily.  90 tablet  3  . ibuprofen (ADVIL,MOTRIN) 200 MG tablet Take 200 mg by mouth every 6 (six) hours as needed.        . latanoprost (XALATAN) 0.005 % ophthalmic solution 1 drop at bedtime.        . memantine (NAMENDA) 10 MG tablet Take 1 tablet (10 mg total) by mouth 2 (two) times daily.  180 tablet  3  . Multiple Vitamin (MULTIVITAMIN) tablet Take 1 tablet by mouth daily.        . nitroGLYCERIN (NITROSTAT) 0.4 MG SL tablet Place 0.4 mg under the tongue every 5 (five) minutes as needed.        . simvastatin (ZOCOR) 20  MG tablet Take 1 tablet (20 mg total) by mouth at bedtime.  90 tablet  3   No current facility-administered medications for this visit.    Allergies  Allergen Reactions  . Other     ZPAK    Patient Active Problem List   Diagnosis Date Noted  . Chronic combined systolic and diastolic congestive heart failure 12/06/2010  . Dyslipidemia 12/06/2010  . Dementia 12/06/2010  . Hx of CABG 12/06/2010    History  Smoking status  . Former Smoker  . Quit date: 06/13/1983  Smokeless tobacco  . Never Used    History  Alcohol Use No    Family History  Problem Relation Age of Onset  . Heart disease Father     Review of Systems: Constitutional: no fever chills diaphoresis or fatigue or change in weight.  Head and neck: no hearing loss, no epistaxis, no photophobia or visual disturbance. Respiratory: No cough, shortness of breath or wheezing. Cardiovascular: No chest pain peripheral edema, palpitations. Gastrointestinal: No abdominal distention, no abdominal pain, no change in bowel habits hematochezia or melena. Genitourinary: No dysuria, no frequency, no urgency, no nocturia. Musculoskeletal:No arthralgias, no back pain, no gait disturbance or myalgias. Neurological:  No dizziness, no headaches, no numbness, no seizures, no syncope, no weakness, no tremors. Hematologic: No lymphadenopathy, no easy bruising. Psychiatric: No confusion, no hallucinations, no sleep disturbance.    Physical Exam: Filed Vitals:   12/17/12 1139  BP: 124/72  Pulse: 54   the general appearance reveals an elderly gentleman who is demented but pleasant.The head and neck exam reveals pupils equal and reactive.  Extraocular movements are full.  There is no scleral icterus.  The mouth and pharynx are normal.  The neck is supple.  The carotids reveal no bruits.  The jugular venous pressure is normal.  The  thyroid is not enlarged.  There is no lymphadenopathy.  The chest is clear to percussion and  auscultation.  There are no rales or rhonchi.  Expansion of the chest is symmetrical.  The precordium is quiet.  The first heart sound is normal.  The second heart sound is physiologically split.  There is no murmur gallop rub or click.  There is no abnormal lift or heave.  The abdomen is soft and nontender.  The bowel sounds are normal.  The liver and spleen are not enlarged.  There are no abdominal masses.  There are no abdominal bruits.  Extremities reveal good pedal pulses.  There is no phlebitis or edema.  There is no cyanosis or clubbing.  Strength is normal and symmetrical in all extremities.  There is no lateralizing weakness.  There are no sensory deficits.  The skin is warm and dry.  There is no rash.  EKG today shows sinus bradycardia with first degree AV block.  No prior EKGs available in the clinic.  There is a pattern of an old inferior wall myocardial infarction.   Assessment / Plan: Continue same medication.  Blood work today pending.  Recheck in 4 months for followup office visit lipid panel hepatic function panel and basal metabolic panel.

## 2012-12-17 NOTE — Patient Instructions (Addendum)

## 2012-12-17 NOTE — Assessment & Plan Note (Signed)
The patient has not been experiencing any chest pain or angina pectoris. 

## 2012-12-17 NOTE — Assessment & Plan Note (Signed)
Has dementia  remains severe.  He no longer drives a car.  His wife and all the family affairs etc.

## 2012-12-17 NOTE — Assessment & Plan Note (Signed)
The patient has not been having any evidence of fluid retention.  Fluid balance appears to be euvolemic at this time on current therapy

## 2012-12-18 ENCOUNTER — Telehealth: Payer: Self-pay | Admitting: *Deleted

## 2012-12-18 NOTE — Telephone Encounter (Signed)
Message copied by Burnell Blanks on Wed Dec 18, 2012  2:38 PM ------      Message from: Cassell Clement      Created: Wed Dec 18, 2012  9:59 AM       Please report to patient.  The recent labs are stable. Continue same medication and careful diet. TGs still high---avoid sweets. ------

## 2012-12-18 NOTE — Telephone Encounter (Signed)
Advised wife of labs 

## 2012-12-18 NOTE — Progress Notes (Signed)
Quick Note:  Please report to patient. The recent labs are stable. Continue same medication and careful diet. TGs still high---avoid sweets. ______

## 2013-01-18 ENCOUNTER — Other Ambulatory Visit: Payer: Self-pay | Admitting: Cardiology

## 2013-01-20 ENCOUNTER — Other Ambulatory Visit: Payer: Self-pay | Admitting: Cardiology

## 2013-01-31 ENCOUNTER — Other Ambulatory Visit: Payer: Self-pay | Admitting: Cardiology

## 2013-05-07 ENCOUNTER — Encounter: Payer: Self-pay | Admitting: Cardiology

## 2013-05-07 ENCOUNTER — Ambulatory Visit (INDEPENDENT_AMBULATORY_CARE_PROVIDER_SITE_OTHER): Payer: Medicare Other | Admitting: Cardiology

## 2013-05-07 ENCOUNTER — Other Ambulatory Visit (INDEPENDENT_AMBULATORY_CARE_PROVIDER_SITE_OTHER): Payer: Medicare Other

## 2013-05-07 VITALS — BP 135/63 | HR 80 | Ht 68.0 in | Wt 173.4 lb

## 2013-05-07 DIAGNOSIS — I5042 Chronic combined systolic (congestive) and diastolic (congestive) heart failure: Secondary | ICD-10-CM

## 2013-05-07 DIAGNOSIS — Z951 Presence of aortocoronary bypass graft: Secondary | ICD-10-CM

## 2013-05-07 DIAGNOSIS — E785 Hyperlipidemia, unspecified: Secondary | ICD-10-CM

## 2013-05-07 DIAGNOSIS — Z79899 Other long term (current) drug therapy: Secondary | ICD-10-CM

## 2013-05-07 DIAGNOSIS — R413 Other amnesia: Secondary | ICD-10-CM

## 2013-05-07 DIAGNOSIS — I509 Heart failure, unspecified: Secondary | ICD-10-CM

## 2013-05-07 LAB — LIPID PANEL
Cholesterol: 119 mg/dL (ref 0–200)
LDL Cholesterol: 52 mg/dL (ref 0–99)
Total CHOL/HDL Ratio: 3
Triglycerides: 159 mg/dL — ABNORMAL HIGH (ref 0.0–149.0)
VLDL: 31.8 mg/dL (ref 0.0–40.0)

## 2013-05-07 LAB — BASIC METABOLIC PANEL
CO2: 32 mEq/L (ref 19–32)
Chloride: 106 mEq/L (ref 96–112)
Potassium: 3.9 mEq/L (ref 3.5–5.1)
Sodium: 141 mEq/L (ref 135–145)

## 2013-05-07 LAB — HEPATIC FUNCTION PANEL
Albumin: 4 g/dL (ref 3.5–5.2)
Total Protein: 7.5 g/dL (ref 6.0–8.3)

## 2013-05-07 NOTE — Assessment & Plan Note (Signed)
The patient is tolerating simvastatin.  No myalgias.  Blood work is pending.

## 2013-05-07 NOTE — Assessment & Plan Note (Signed)
The patient has not been experiencing any chest pain or recurrent angina. 

## 2013-05-07 NOTE — Assessment & Plan Note (Signed)
The patient is not having any orthopnea or paroxysmal nocturnal dyspnea.

## 2013-05-07 NOTE — Progress Notes (Signed)
Quick Note:  Please report to patient. The recent labs are stable. Continue same medication and careful diet. BS and TG higher. Watch sweets ______

## 2013-05-07 NOTE — Patient Instructions (Signed)
Your physician recommends that you continue on your current medications as directed. Please refer to the Current Medication list given to you today.  Your physician recommends that you schedule a follow-up appointment in: 4 months with fasting labs (lp/bmet/hfp/cbc)  

## 2013-05-07 NOTE — Progress Notes (Signed)
Darci Current Dayal Date of Birth:  1924/08/10 [address] [facility] [address] [place], [place]  [address] [phone]         Fax   [phone]  History of Present Illness: This pleasant 79 year old gentleman is seen for a four-month followup office visit. He has a history of ischemic heart disease. He has a history of a remote inferior wall myocardial infarction. In 1985 Dr. Andrey Campanile performed coronary artery bypass graft surgery. Patient has a past history of acute on chronic systolic congestive heart failure. His left ventricular ejection fractions have range in the 35-40% range by echocardiogram in 2009. The patient has a prior history of a previous stroke for which he was hospitalized on the neurology service. He has had a history of progressive dementia. Since last visit he has had no new cardiac symptoms .  His appetite has decreased and his weight is down 8 pounds.  He denies any localizing signs or symptoms.  No evidence of GI bleed etc.   Current Outpatient Prescriptions  Medication Sig Dispense Refill  . aspirin 81 MG tablet Take 81 mg by mouth daily.        . brimonidine (ALPHAGAN P) 0.1 % SOLN Apply to eye 3 (three) times daily.       . Calcium Carbonate-Vitamin D (CALTRATE 600+D PO) Take by mouth daily.        . clopidogrel (PLAVIX) 75 MG tablet Take 1 tablet (75 mg total) by mouth daily.  90 tablet  3  . donepezil (ARICEPT) 10 MG tablet TAKE 1 TABLET (10 MG TOTAL)AT BEDTIME AS NEEDED  90 tablet  3  . furosemide (LASIX) 20 MG tablet Take 1 tablet (20 mg total) by mouth daily.  90 tablet  3  . ibuprofen (ADVIL,MOTRIN) 200 MG tablet Take 200 mg by mouth every 6 (six) hours as needed.        . latanoprost (XALATAN) 0.005 % ophthalmic solution 1 drop at bedtime.        . memantine (NAMENDA) 10 MG tablet Take 1 tablet (10 mg total) by mouth 2 (two) times daily.  180 tablet  3  . Multiple Vitamin (MULTIVITAMIN) tablet Take 1 tablet by mouth daily.        . nitroGLYCERIN (NITROSTAT) 0.4 MG  SL tablet Place 0.4 mg under the tongue every 5 (five) minutes as needed.        . simvastatin (ZOCOR) 20 MG tablet Take 1 tablet (20 mg total) by mouth at bedtime.  90 tablet  3   No current facility-administered medications for this visit.    Allergies  Allergen Reactions  . Other     ZPAK    Patient Active Problem List   Diagnosis Date Noted  . Chronic combined systolic and diastolic congestive heart failure 12/06/2010  . Dyslipidemia 12/06/2010  . Dementia 12/06/2010  . Hx of CABG 12/06/2010    History  Smoking status  . Former Smoker  . Quit date: 06/13/1983  Smokeless tobacco  . Never Used    History  Alcohol Use No    Family History  Problem Relation Age of Onset  . Heart disease Father     Review of Systems: Constitutional: no fever chills diaphoresis or fatigue or change in weight.  Head and neck: no hearing loss, no epistaxis, no photophobia or visual disturbance. Respiratory: No cough, shortness of breath or wheezing. Cardiovascular: No chest pain peripheral edema, palpitations. Gastrointestinal: No abdominal distention, no abdominal pain, no change in bowel habits  hematochezia or melena. Genitourinary: No dysuria, no frequency, no urgency, no nocturia. Musculoskeletal:No arthralgias, no back pain, no gait disturbance or myalgias. Neurological: No dizziness, no headaches, no numbness, no seizures, no syncope, no weakness, no tremors. Hematologic: No lymphadenopathy, no easy bruising. Psychiatric: No confusion, no hallucinations, no sleep disturbance.    Physical Exam: Filed Vitals:   05/07/13 1039  BP: 135/63  Pulse: 80   the general appearance reveals an elderly gentleman who is demented but pleasant.The head and neck exam reveals pupils equal and reactive.  Extraocular movements are full.  There is no scleral icterus.  The mouth and pharynx are normal.  The neck is supple.  The carotids reveal no bruits.  The jugular venous pressure is normal.  The   thyroid is not enlarged.  There is no lymphadenopathy.  The chest is clear to percussion and auscultation.  There are no rales or rhonchi.  Expansion of the chest is symmetrical.  The precordium is quiet.  The first heart sound is normal.  The second heart sound is physiologically split.  There is no murmur gallop rub or click.  There is no abnormal lift or heave.  The abdomen is soft and nontender.  The bowel sounds are normal.  The liver and spleen are not enlarged.  There are no abdominal masses.  There are no abdominal bruits.  Extremities reveal good pedal pulses.  There is no phlebitis or edema.  There is no cyanosis or clubbing.  Strength is normal and symmetrical in all extremities.  There is no lateralizing weakness.  There are no sensory deficits.  The skin is warm and dry.  There is no rash.    Assessment / Plan: Continue same medication.  Blood work today pending.  Recheck in 4 months for followup office visit lipid panel hepatic function panel and basal metabolic panel.  Try not to lose any more weight.

## 2013-05-28 ENCOUNTER — Other Ambulatory Visit: Payer: Self-pay | Admitting: Cardiology

## 2013-07-06 ENCOUNTER — Other Ambulatory Visit: Payer: Self-pay | Admitting: Cardiology

## 2013-09-23 ENCOUNTER — Ambulatory Visit (INDEPENDENT_AMBULATORY_CARE_PROVIDER_SITE_OTHER): Payer: Medicare Other | Admitting: Cardiology

## 2013-09-23 ENCOUNTER — Encounter: Payer: Self-pay | Admitting: Cardiology

## 2013-09-23 VITALS — BP 142/66 | HR 58 | Ht 68.0 in | Wt 173.4 lb

## 2013-09-23 DIAGNOSIS — R413 Other amnesia: Secondary | ICD-10-CM

## 2013-09-23 DIAGNOSIS — F0390 Unspecified dementia without behavioral disturbance: Secondary | ICD-10-CM

## 2013-09-23 DIAGNOSIS — I5042 Chronic combined systolic (congestive) and diastolic (congestive) heart failure: Secondary | ICD-10-CM

## 2013-09-23 DIAGNOSIS — Z951 Presence of aortocoronary bypass graft: Secondary | ICD-10-CM

## 2013-09-23 DIAGNOSIS — I509 Heart failure, unspecified: Secondary | ICD-10-CM

## 2013-09-23 DIAGNOSIS — Z79899 Other long term (current) drug therapy: Secondary | ICD-10-CM

## 2013-09-23 DIAGNOSIS — E785 Hyperlipidemia, unspecified: Secondary | ICD-10-CM

## 2013-09-23 LAB — BASIC METABOLIC PANEL
BUN: 14 mg/dL (ref 6–23)
CO2: 27 mEq/L (ref 19–32)
Calcium: 9 mg/dL (ref 8.4–10.5)
Chloride: 107 mEq/L (ref 96–112)
Creatinine, Ser: 1.2 mg/dL (ref 0.4–1.5)
GFR: 63.37 mL/min (ref >60.00)
Glucose, Bld: 96 mg/dL (ref 70–99)
POTASSIUM: 3.4 meq/L — AB (ref 3.5–5.1)
SODIUM: 141 meq/L (ref 135–145)

## 2013-09-23 LAB — LIPID PANEL
CHOLESTEROL: 108 mg/dL (ref 0–200)
HDL: 38 mg/dL — AB (ref >39.00)
LDL CALC: 53 mg/dL (ref 0–99)
Total CHOL/HDL Ratio: 3
Triglycerides: 85 mg/dL (ref 0.0–149.0)
VLDL: 17 mg/dL (ref 0.0–40.0)

## 2013-09-23 LAB — HEPATIC FUNCTION PANEL
ALT: 10 U/L (ref 0–53)
AST: 14 U/L (ref 0–37)
Albumin: 3.6 g/dL (ref 3.5–5.2)
Alkaline Phosphatase: 47 U/L (ref 39–117)
Bilirubin, Direct: 0.1 mg/dL (ref 0.0–0.3)
TOTAL PROTEIN: 6.8 g/dL (ref 6.0–8.3)
Total Bilirubin: 1.1 mg/dL (ref 0.3–1.2)

## 2013-09-23 LAB — CBC WITH DIFFERENTIAL/PLATELET
Basophils Absolute: 0 10*3/uL (ref 0.0–0.1)
Basophils Relative: 0.4 % (ref 0.0–3.0)
Eosinophils Absolute: 0.2 10*3/uL (ref 0.0–0.7)
Eosinophils Relative: 1.8 % (ref 0.0–5.0)
HEMATOCRIT: 41.5 % (ref 39.0–52.0)
HEMOGLOBIN: 13.7 g/dL (ref 13.0–17.0)
Lymphocytes Relative: 26.6 % (ref 12.0–46.0)
Lymphs Abs: 2.6 10*3/uL (ref 0.7–4.0)
MCHC: 33.1 g/dL (ref 30.0–36.0)
MCV: 95.6 fl (ref 78.0–100.0)
MONOS PCT: 10.8 % (ref 3.0–12.0)
Monocytes Absolute: 1 10*3/uL (ref 0.1–1.0)
NEUTROS ABS: 5.9 10*3/uL (ref 1.4–7.7)
Neutrophils Relative %: 60.4 % (ref 43.0–77.0)
PLATELETS: 163 10*3/uL (ref 150.0–400.0)
RBC: 4.34 Mil/uL (ref 4.22–5.81)
RDW: 15.7 % — ABNORMAL HIGH (ref 11.5–14.6)
WBC: 9.7 10*3/uL (ref 4.5–10.5)

## 2013-09-23 NOTE — Assessment & Plan Note (Signed)
The patient is not having any orthopnea or paroxysmal nocturnal dyspnea.  No peripheral edema.  Weight is stable since last visit.

## 2013-09-23 NOTE — Progress Notes (Signed)
Quick Note:  Please report to patient. The recent labs are stable. Continue same medication and careful diet. Potassium is slightly low. Increase bananas and fruit. ______

## 2013-09-23 NOTE — Assessment & Plan Note (Signed)
We are checking lipids today

## 2013-09-23 NOTE — Assessment & Plan Note (Signed)
The patient has not had any recurrent chest pain 

## 2013-09-23 NOTE — Patient Instructions (Signed)
Will obtain labs today and call you with the results (lp,bmet,hfp,cbc)  Your physician recommends that you continue on your current medications as directed. Please refer to the Current Medication list given to you today.  Your physician wants you to follow-up in: 4 months with fasting labs (lp/bmet/hfp) and EKG  You will receive a reminder letter in the mail two months in advance. If you don't receive a letter, please call our office to schedule the follow-up appointment.

## 2013-09-23 NOTE — Progress Notes (Signed)
[name] [name] Date of Birth:  August 04, 1924 [address] [place], [place]  [id number] [phone]         Fax   [phone]  History of Present Illness: This pleasant 80 year old gentleman is seen for a four-month followup office visit. He has a history of ischemic heart disease. He has a history of a remote inferior wall myocardial infarction. In 1985 Dr. Andrey CampanileWilson performed coronary artery bypass graft surgery. Patient has a past history of acute on chronic systolic congestive heart failure. His left ventricular ejection fractions have range in the 35-40% range by echocardiogram in 2009. The patient has a prior history of a previous stroke for which he was hospitalized on the neurology service. He has had a history of progressive dementia. Since last visit he has had no new cardiac symptoms .  His appetite has decreased and his weight is unchanged.  He denies any localizing signs or symptoms.  No evidence of GI bleed etc.   Current Outpatient Prescriptions  Medication Sig Dispense Refill  . aspirin 81 MG tablet Take 81 mg by mouth daily.        . brimonidine (ALPHAGAN P) 0.1 % SOLN Apply to eye 3 (three) times daily.       . Calcium Carbonate-Vitamin D (CALTRATE 600+D PO) Take by mouth daily.        . clopidogrel (PLAVIX) 75 MG tablet Take 1 tablet (75 mg total) by mouth daily.  90 tablet  3  . donepezil (ARICEPT) 10 MG tablet TAKE 1 TABLET (10 MG TOTAL)AT BEDTIME AS NEEDED  90 tablet  3  . furosemide (LASIX) 20 MG tablet Take 1 tablet (20 mg total) by mouth daily.  90 tablet  3  . ibuprofen (ADVIL,MOTRIN) 200 MG tablet Take 200 mg by mouth every 6 (six) hours as needed.        . latanoprost (XALATAN) 0.005 % ophthalmic solution 1 drop at bedtime.        . Multiple Vitamin (MULTIVITAMIN) tablet Take 1 tablet by mouth daily.        Marland Kitchen. NAMENDA 10 MG tablet TAKE 1 TABLET TWICE A DAY  180 tablet  2  . nitroGLYCERIN (NITROSTAT) 0.4 MG SL tablet Place 0.4 mg under the tongue every  5 (five) minutes as needed.        . simvastatin (ZOCOR) 20 MG tablet TAKE 1 TABLET AT BEDTIME  90 tablet  1   No current facility-administered medications for this visit.    Allergies  Allergen Reactions  . Other     ZPAK    Patient Active Problem List   Diagnosis Date Noted  . Chronic combined systolic and diastolic congestive heart failure 12/06/2010  . Dyslipidemia 12/06/2010  . Dementia 12/06/2010  . Hx of CABG 12/06/2010    History  Smoking status  . Former Smoker  . Quit date: 06/13/1983  Smokeless tobacco  . Never Used    History  Alcohol Use No    Family History  Problem Relation Age of Onset  . Heart disease Father     Review of Systems: Constitutional: no fever chills diaphoresis or fatigue or change in weight.  Head and neck: no hearing loss, no epistaxis, no photophobia or visual disturbance. Respiratory: No cough, shortness of breath or wheezing. Cardiovascular: No chest pain peripheral edema, palpitations. Gastrointestinal: No abdominal distention, no abdominal pain, no change in bowel habits hematochezia or melena. Genitourinary: No dysuria, no frequency, no urgency, no nocturia. Musculoskeletal:No  arthralgias, no back pain, no gait disturbance or myalgias. Neurological: No dizziness, no headaches, no numbness, no seizures, no syncope, no weakness, no tremors. Hematologic: No lymphadenopathy, no easy bruising. Psychiatric: No confusion, no hallucinations, no sleep disturbance.    Physical Exam: Filed Vitals:   09/23/13 1120  BP: 142/66  Pulse: 58   the general appearance reveals an elderly gentleman who is demented but pleasant.The head and neck exam reveals pupils equal and reactive.  Extraocular movements are full.  There is no scleral icterus.  The mouth and pharynx are normal.  The neck is supple.  The carotids reveal no bruits.  The jugular venous pressure is normal.  The  thyroid is not enlarged.  There is no lymphadenopathy.  The chest is  clear to percussion and auscultation.  There are no rales or rhonchi.  Expansion of the chest is symmetrical.  The precordium is quiet.  The first heart sound is normal.  The second heart sound is physiologically split.  There is no murmur gallop rub or click.  There is no abnormal lift or heave.  The abdomen is soft and nontender.  The bowel sounds are normal.  The liver and spleen are not enlarged.  There are no abdominal masses.  There are no abdominal bruits.  Extremities reveal good pedal pulses.  There is no phlebitis or edema.  There is no cyanosis or clubbing.  Strength is normal and symmetrical in all extremities.  There is no lateralizing weakness.  There are no sensory deficits.  The skin is warm and dry.  There is no rash.    Assessment / Plan: Continue same medication.  Blood work today pending.  Recheck in 4 months for followup office visit lipid panel hepatic function panel and basal metabolic panel.  And EKG.

## 2013-09-23 NOTE — Assessment & Plan Note (Signed)
His dementia appears to be severe but stable on current therapy of Aricept and Namenda

## 2013-09-24 NOTE — Telephone Encounter (Signed)
error 

## 2014-01-06 ENCOUNTER — Other Ambulatory Visit: Payer: Self-pay | Admitting: Cardiology

## 2014-01-18 ENCOUNTER — Other Ambulatory Visit: Payer: Self-pay | Admitting: Cardiology

## 2014-01-26 ENCOUNTER — Encounter: Payer: Self-pay | Admitting: Cardiology

## 2014-01-26 ENCOUNTER — Other Ambulatory Visit: Payer: Medicare Other

## 2014-01-26 ENCOUNTER — Ambulatory Visit (INDEPENDENT_AMBULATORY_CARE_PROVIDER_SITE_OTHER): Payer: Medicare Other | Admitting: Cardiology

## 2014-01-26 VITALS — BP 116/72 | HR 67 | Ht 68.5 in | Wt 185.0 lb

## 2014-01-26 DIAGNOSIS — E785 Hyperlipidemia, unspecified: Secondary | ICD-10-CM

## 2014-01-26 DIAGNOSIS — R413 Other amnesia: Secondary | ICD-10-CM

## 2014-01-26 DIAGNOSIS — I509 Heart failure, unspecified: Secondary | ICD-10-CM

## 2014-01-26 DIAGNOSIS — I5042 Chronic combined systolic (congestive) and diastolic (congestive) heart failure: Secondary | ICD-10-CM

## 2014-01-26 DIAGNOSIS — Z951 Presence of aortocoronary bypass graft: Secondary | ICD-10-CM

## 2014-01-26 DIAGNOSIS — R609 Edema, unspecified: Secondary | ICD-10-CM | POA: Insufficient documentation

## 2014-01-26 DIAGNOSIS — I440 Atrioventricular block, first degree: Secondary | ICD-10-CM | POA: Insufficient documentation

## 2014-01-26 DIAGNOSIS — R600 Localized edema: Secondary | ICD-10-CM | POA: Insufficient documentation

## 2014-01-26 LAB — LIPID PANEL
CHOL/HDL RATIO: 3
Cholesterol: 93 mg/dL (ref 0–200)
HDL: 33.3 mg/dL — ABNORMAL LOW (ref >39.00)
LDL CALC: 47 mg/dL (ref 0–99)
NonHDL: 59.7
Triglycerides: 63 mg/dL (ref 0.0–149.0)
VLDL: 12.6 mg/dL (ref 0.0–40.0)

## 2014-01-26 LAB — BASIC METABOLIC PANEL
BUN: 19 mg/dL (ref 6–23)
CHLORIDE: 108 meq/L (ref 96–112)
CO2: 26 mEq/L (ref 19–32)
Calcium: 9.2 mg/dL (ref 8.4–10.5)
Creatinine, Ser: 1.3 mg/dL (ref 0.4–1.5)
GFR: 57.55 mL/min — ABNORMAL LOW (ref >60.00)
GLUCOSE: 88 mg/dL (ref 70–99)
POTASSIUM: 3.4 meq/L — AB (ref 3.5–5.1)
Sodium: 143 mEq/L (ref 135–145)

## 2014-01-26 LAB — HEPATIC FUNCTION PANEL
ALT: 12 U/L (ref 0–53)
AST: 23 U/L (ref 0–37)
Albumin: 3.6 g/dL (ref 3.5–5.2)
Alkaline Phosphatase: 63 U/L (ref 39–117)
BILIRUBIN DIRECT: 0.2 mg/dL (ref 0.0–0.3)
BILIRUBIN TOTAL: 1.7 mg/dL — AB (ref 0.2–1.2)
TOTAL PROTEIN: 6.8 g/dL (ref 6.0–8.3)

## 2014-01-26 MED ORDER — FUROSEMIDE 20 MG PO TABS: 40.0000 mg | ORAL_TABLET | Freq: Every day | ORAL | Status: DC

## 2014-01-26 NOTE — Assessment & Plan Note (Signed)
The patient denies any increase in exertional dyspnea.  He is quite sedentary because of his dementia.  No regular exercise .  His wife states that he does not eat much salt.

## 2014-01-26 NOTE — Assessment & Plan Note (Signed)
The patient has not had any chest pain or angina 

## 2014-01-26 NOTE — Assessment & Plan Note (Signed)
His peripheral edema has increased.  He was not weighed today.  We will increase his furosemide up to 40 mg daily instead of 20.  Continue to limit dietary salt.

## 2014-01-26 NOTE — Assessment & Plan Note (Signed)
EKG today shows first degree heart block which has increased since previous tracing of 12/17/12.  His PR interval is 370 ms.  This may reflect the pharmacologic effect of Aricept on his AV node.  The patient is not having any dizziness or syncope.  We will continue current medication.

## 2014-01-26 NOTE — Patient Instructions (Addendum)
Your physician has recommended you make the following change in your medication: increase Lasix to 40 mg daily  Your physician recommends that you return for lab work in: today and in 4 months at next office visit (Lipids, LFT's and BMET)  Your physician recommends that you schedule a follow-up appointment in: 4 months

## 2014-01-26 NOTE — Progress Notes (Signed)
[name] [name] Date of Birth:  10-25-1924 [facility] [address] [place], [place]  [address] [phone]        Fax   [phone]   History of Present Illness: This pleasant 80 year old gentleman is seen for a four-month followup office visit. He has a history of ischemic heart disease. He has a history of a remote inferior wall myocardial infarction. In 1985 Dr. Andrey Campanile performed coronary artery bypass graft surgery. Patient has a past history of acute on chronic systolic congestive heart failure. His left ventricular ejection fractions have range in the 35-40% range by echocardiogram in 2009. The patient has a prior history of a previous stroke for which he was hospitalized on the neurology service. He has had a history of progressive dementia. Since last visit he has had no new cardiac symptoms . His appetite has decreased and his weight is unchanged. He denies any localizing signs or symptoms. No evidence of GI bleed etc. his dementia remains severe.  He has had increased peripheral edema since last visit.   Current Outpatient Prescriptions  Medication Sig Dispense Refill  . aspirin 81 MG tablet Take 81 mg by mouth daily.        . brimonidine (ALPHAGAN P) 0.1 % SOLN Apply to eye 3 (three) times daily.       . Calcium Carbonate-Vitamin D (CALTRATE 600+D PO) Take by mouth daily.        . clopidogrel (PLAVIX) 75 MG tablet Take 1 tablet (75 mg total) by mouth daily.  90 tablet  3  . donepezil (ARICEPT) 10 MG tablet TAKE 1 TABLET (10 MG TOTAL)AT BEDTIME AS NEEDED  90 tablet  3  . furosemide (LASIX) 20 MG tablet Take 2 tablets (40 mg total) by mouth daily.  90 tablet  1  . ibuprofen (ADVIL,MOTRIN) 200 MG tablet Take 200 mg by mouth every 6 (six) hours as needed.        . latanoprost (XALATAN) 0.005 % ophthalmic solution 1 drop at bedtime.        . Multiple Vitamin (MULTIVITAMIN) tablet Take 1 tablet by mouth daily.        Marland Kitchen NAMENDA 10 MG tablet TAKE 1 TABLET TWICE A  DAY  180 tablet  2  . nitroGLYCERIN (NITROSTAT) 0.4 MG SL tablet Place 0.4 mg under the tongue every 5 (five) minutes as needed.        . simvastatin (ZOCOR) 20 MG tablet TAKE 1 TABLET AT BEDTIME  90 tablet  0   No current facility-administered medications for this visit.    Allergies  Allergen Reactions  . Other     ZPAK    Patient Active Problem List   Diagnosis Date Noted  . Peripheral edema 01/26/2014  . Chronic combined systolic and diastolic congestive heart failure 12/06/2010  . Dyslipidemia 12/06/2010  . Dementia 12/06/2010  . Hx of CABG 12/06/2010    History  Smoking status  . Former Smoker  . Quit date: 06/13/1983  Smokeless tobacco  . Never Used    History  Alcohol Use No    Family History  Problem Relation Age of Onset  . Heart disease Father     Review of Systems: Constitutional: no fever chills diaphoresis or fatigue or change in weight.  Head and neck: no hearing loss, no epistaxis, no photophobia or visual disturbance. Respiratory: No cough, shortness of breath or wheezing. Cardiovascular: No chest pain peripheral edema, palpitations. Gastrointestinal: No abdominal distention, no abdominal  pain, no change in bowel habits hematochezia or melena. Genitourinary: No dysuria, no frequency, no urgency, no nocturia. Musculoskeletal:No arthralgias, no back pain, no gait disturbance or myalgias. Neurological: No dizziness, no headaches, no numbness, no seizures, no syncope, no weakness, no tremors. Hematologic: No lymphadenopathy, no easy bruising. Psychiatric: No confusion, no hallucinations, no sleep disturbance.    Physical Exam: Filed Vitals:   01/26/14 0933  BP: 116/72  Pulse: 67   the general appearance reveals a somewhat disheveled elderly gentleman in no acute distress.The head and neck exam reveals pupils equal and reactive.  Extraocular movements are full.  There is no scleral icterus.  The mouth and pharynx are normal.  The neck is supple.   The carotids reveal no bruits.  The jugular venous pressure is normal.  The  thyroid is not enlarged.  There is no lymphadenopathy.  The chest is clear to percussion and auscultation.  There are no rales or rhonchi.  Expansion of the chest is symmetrical.  The precordium is quiet.  The first heart sound is normal.  The second heart sound is physiologically split.  There is no murmur gallop rub or click.  There is no abnormal lift or heave.  The abdomen is soft and nontender.  The bowel sounds are normal.  The liver and spleen are not enlarged.  There are no abdominal masses.  There are no abdominal bruits.  Extremities reveal good pedal pulses.  There is 3+ pretibial pitting edema.  There is no cyanosis or clubbing.  Strength is normal and symmetrical in all extremities.  There is no lateralizing weakness.  There are no sensory deficits.  The skin is warm and dry.  There is no rash.  EKG shows normal sinus rhythm with marked first degree AV block and a pattern of an old inferior wall myocardial infarction.  Since previous tracing of 12/17/12, the PR interval is longer.   Assessment / Plan: 1. ischemic heart disease status post old inferior wall myocardial infarction and status post CABG in 1985. 2. chronic systolic heart failure with ejection fraction of 35-40% by echocardiogram 2009 3. history of remote stroke on dual antiplatelet therapy. 4. first degree AV block 5. Hypercholesterolemia 6. Dementia  Plan: Continue same medication except increase Lasix to 40 mg daily.  Blood work today pending.  Recheck in 4 months for followup office visit and lipid panel hepatic function panel and basal metabolic

## 2014-01-27 ENCOUNTER — Other Ambulatory Visit: Payer: Self-pay | Admitting: *Deleted

## 2014-01-27 MED ORDER — SIMVASTATIN 10 MG PO TABS: 10.0000 mg | ORAL_TABLET | Freq: Every day | ORAL | Status: DC

## 2014-01-27 MED ORDER — POTASSIUM CHLORIDE CRYS ER 20 MEQ PO TBCR: 20.0000 meq | EXTENDED_RELEASE_TABLET | Freq: Every day | ORAL | Status: DC

## 2014-01-28 ENCOUNTER — Other Ambulatory Visit: Payer: Medicare Other

## 2014-01-28 ENCOUNTER — Ambulatory Visit: Payer: Medicare Other | Admitting: Cardiology

## 2014-02-02 ENCOUNTER — Other Ambulatory Visit: Payer: Self-pay | Admitting: *Deleted

## 2014-02-02 MED ORDER — CLOPIDOGREL BISULFATE 75 MG PO TABS
75.0000 mg | ORAL_TABLET | Freq: Every day | ORAL | Status: DC
Start: 2014-02-02 — End: 2014-04-28

## 2014-02-02 MED ORDER — SIMVASTATIN 10 MG PO TABS: 10.0000 mg | ORAL_TABLET | Freq: Every day | ORAL | Status: DC

## 2014-02-21 ENCOUNTER — Other Ambulatory Visit: Payer: Self-pay | Admitting: Cardiology

## 2014-02-26 ENCOUNTER — Other Ambulatory Visit: Payer: Self-pay | Admitting: Cardiology

## 2014-03-11 ENCOUNTER — Other Ambulatory Visit: Payer: Self-pay | Admitting: Dermatology

## 2014-03-18 ENCOUNTER — Other Ambulatory Visit: Payer: Self-pay | Admitting: Cardiology

## 2014-04-10 ENCOUNTER — Other Ambulatory Visit: Payer: Self-pay | Admitting: Cardiology

## 2014-04-28 ENCOUNTER — Other Ambulatory Visit: Payer: Self-pay | Admitting: Cardiology

## 2014-05-19 ENCOUNTER — Other Ambulatory Visit: Payer: Self-pay | Admitting: Nurse Practitioner

## 2014-06-02 ENCOUNTER — Ambulatory Visit: Payer: Medicare Other | Admitting: Cardiology

## 2014-06-02 ENCOUNTER — Other Ambulatory Visit: Payer: Medicare Other

## 2014-06-17 ENCOUNTER — Encounter: Payer: Self-pay | Admitting: Cardiology

## 2014-06-17 ENCOUNTER — Ambulatory Visit (INDEPENDENT_AMBULATORY_CARE_PROVIDER_SITE_OTHER): Payer: Medicare Other | Admitting: Cardiology

## 2014-06-17 ENCOUNTER — Other Ambulatory Visit (INDEPENDENT_AMBULATORY_CARE_PROVIDER_SITE_OTHER): Payer: Medicare Other | Admitting: *Deleted

## 2014-06-17 VITALS — BP 128/70 | HR 58 | Ht 68.0 in | Wt 146.0 lb

## 2014-06-17 DIAGNOSIS — R634 Abnormal weight loss: Secondary | ICD-10-CM

## 2014-06-17 DIAGNOSIS — Z951 Presence of aortocoronary bypass graft: Secondary | ICD-10-CM

## 2014-06-17 DIAGNOSIS — E785 Hyperlipidemia, unspecified: Secondary | ICD-10-CM | POA: Diagnosis not present

## 2014-06-17 DIAGNOSIS — I5042 Chronic combined systolic (congestive) and diastolic (congestive) heart failure: Secondary | ICD-10-CM | POA: Diagnosis not present

## 2014-06-17 DIAGNOSIS — R413 Other amnesia: Secondary | ICD-10-CM

## 2014-06-17 LAB — BASIC METABOLIC PANEL
BUN: 15 mg/dL (ref 6–23)
CO2: 28 meq/L (ref 19–32)
CREATININE: 1.1 mg/dL (ref 0.4–1.5)
Calcium: 9 mg/dL (ref 8.4–10.5)
Chloride: 111 mEq/L (ref 96–112)
GFR: 67.97 mL/min (ref >60.00)
Glucose, Bld: 78 mg/dL (ref 70–99)
Potassium: 4.2 mEq/L (ref 3.5–5.1)
Sodium: 142 mEq/L (ref 135–145)

## 2014-06-17 LAB — LIPID PANEL
CHOLESTEROL: 130 mg/dL (ref 0–200)
HDL: 38.5 mg/dL — ABNORMAL LOW (ref >39.00)
LDL Cholesterol: 71 mg/dL (ref 0–99)
NonHDL: 91.5
Total CHOL/HDL Ratio: 3
Triglycerides: 101 mg/dL (ref 0.0–149.0)
VLDL: 20.2 mg/dL (ref 0.0–40.0)

## 2014-06-17 LAB — HEPATIC FUNCTION PANEL
ALBUMIN: 3.5 g/dL (ref 3.5–5.2)
ALT: 16 U/L (ref 0–53)
AST: 20 U/L (ref 0–37)
Alkaline Phosphatase: 57 U/L (ref 39–117)
BILIRUBIN DIRECT: 0 mg/dL (ref 0.0–0.3)
TOTAL PROTEIN: 6.6 g/dL (ref 6.0–8.3)
Total Bilirubin: 1 mg/dL (ref 0.2–1.2)

## 2014-06-17 NOTE — Progress Notes (Signed)
[name] [name] CurrentVincent J [name] Date of Birth:  15-Feb-1925 [facility] [address] [place], [place]  [address] [phone]        Fax   [phone]   History of Present Illness: This pleasant 81 year old gentleman is seen for a four-month followup office visit. He has a history of ischemic heart disease. He has a history of a remote inferior wall myocardial infarction. In 1985 Dr. Andrey [name] performed coronary artery bypass graft surgery. Patient has a past history of acute on chronic systolic congestive heart failure. His left ventricular ejection fractions have range in the 35-40% range by echocardiogram in 2009. The patient has a prior history of a previous stroke for which he was hospitalized on the neurology service. He has had a history of progressive dementia. Since last visit he has had no new cardiac symptoms . His appetite has decreased and his weight is 27 pounds since last visit. He denies any localizing signs or symptoms. No evidence of GI bleed etc. his dementia remains severe.  He no longer has any peripheral edema. The patient gets very little exercise.  He does walk to the mailbox.  He has not been experiencing any chest pain or angina.. He has not been having any myalgias from his cholesterol medication simvastatin.  Blood work today is pending. His dementia appears to be slightly worse despite generic Aricept and Namenda.   [name] Outpatient Prescriptions  Medication Sig Dispense Refill  . aspirin 81 MG tablet Take 81 mg by mouth daily.      . brimonidine (ALPHAGAN P) 0.1 % SOLN Apply to eye 3 (three) times daily.     . Calcium Carbonate-Vitamin D (CALTRATE 600+D PO) Take by mouth daily.      . clopidogrel (PLAVIX) 75 MG tablet TAKE 1 TABLET DAILY 90 tablet 0  . donepezil (ARICEPT) 10 MG tablet Take 1 tablet (10 mg total) by mouth daily. 90 tablet 3  . furosemide (LASIX) 40 MG tablet Take one tablet daily 90 tablet 1  . ibuprofen (ADVIL,MOTRIN) 200 MG tablet Take  200 mg by mouth every 6 (six) hours as needed.      . latanoprost (XALATAN) 0.005 % ophthalmic solution 1 drop at bedtime.      . Multiple Vitamin (MULTIVITAMIN) tablet Take 1 tablet by mouth daily.      Marland Kitchen. NAMENDA 10 MG tablet TAKE 1 TABLET TWICE A DAY 180 tablet 3  . nitroGLYCERIN (NITROSTAT) 0.4 MG SL tablet Place 0.4 mg under the tongue every 5 (five) minutes as needed.      . Potassium Chloride ER 20 MEQ TBCR TAKE 1 TABLET (20 MEQ TOTAL) BY MOUTH DAILY. 30 tablet 2  . simvastatin (ZOCOR) 10 MG tablet TAKE 1 TABLET DAILY AT 6   P.M. 90 tablet 0   No [name] facility-administered medications for this visit.    Allergies  Allergen Reactions  . Other     ZPAK    Patient Active Problem List   Diagnosis Date Noted  . Weight loss, non-intentional 06/17/2014  . Peripheral edema 01/26/2014  . First degree heart block by electrocardiogram 01/26/2014  . Chronic combined systolic and diastolic congestive heart failure 12/06/2010  . Dyslipidemia 12/06/2010  . Dementia 12/06/2010  . Hx of CABG 12/06/2010    History  Smoking status  . Former Smoker  . Quit date: 06/13/1983  Smokeless tobacco  . Never Used    History  Alcohol Use No    Family History  Problem Relation  Age of Onset  . Heart disease Father     Review of Systems: Constitutional: no fever chills diaphoresis or fatigue or change in weight.  Head and neck: no hearing loss, no epistaxis, no photophobia or visual disturbance. Respiratory: No cough, shortness of breath or wheezing. Cardiovascular: No chest pain peripheral edema, palpitations. Gastrointestinal: No abdominal distention, no abdominal pain, no change in bowel habits hematochezia or melena. Genitourinary: No dysuria, no frequency, no urgency, no nocturia. Musculoskeletal:No arthralgias, no back pain, no gait disturbance or myalgias. Neurological: No dizziness, no headaches, no numbness, no seizures, no syncope, no weakness, no tremors. Hematologic: No  lymphadenopathy, no easy bruising. Psychiatric: No confusion, no hallucinations, no sleep disturbance.    Physical Exam: Filed Vitals:   06/17/14 1114  BP: 128/70  Pulse: 58   the general appearance reveals a somewhat disheveled elderly gentleman in no acute distress.The head and neck exam reveals pupils equal and reactive.  Extraocular movements are full.  There is no scleral icterus.  The mouth and pharynx are normal.  The neck is supple.  The carotids reveal no bruits.  The jugular venous pressure is normal.  The  thyroid is not enlarged.  There is no lymphadenopathy.  The chest is clear to percussion and auscultation.  There are no rales or rhonchi.  Expansion of the chest is symmetrical.  The precordium is quiet.  The first heart sound is normal.  The second heart sound is physiologically split.  There is no murmur gallop rub or click.  There is no abnormal lift or heave.  The abdomen is soft and nontender.  The bowel sounds are normal.  The liver and spleen are not enlarged.  There are no abdominal masses.  There are no abdominal bruits.  Extremities reveal good pedal pulses.  There is 3+ pretibial pitting edema.  There is no cyanosis or clubbing.  Strength is normal and symmetrical in all extremities.  There is no lateralizing weakness.  There are no sensory deficits.  The skin is warm and dry.  There is no rash.  EKG shows normal sinus rhythm with marked first degree AV block and a pattern of an old inferior wall myocardial infarction.  Since previous tracing of 12/17/12, the PR interval is longer.   Assessment / Plan: 1. ischemic heart disease status post old inferior wall myocardial infarction and status post CABG in 1985. 2. chronic systolic heart failure with ejection fraction of 35-40% by echocardiogram 2009 3. history of remote stroke on dual antiplatelet therapy. 4. first degree AV block 5. Hypercholesterolemia 6. Dementia  Plan: Continue same medication .  With his weight loss we  may wish to cut back on his Lasix if his kidney function is worse.  Blood work today pending.  Recheck in 4 months for followup office visit and lipid panel hepatic function panel and basal metabolic

## 2014-06-17 NOTE — Patient Instructions (Signed)
Your physician recommends that you continue on your current medications as directed. Please refer to the Current Medication list given to you today.  Your physician wants you to follow-up in: 4 months with fasting labs (lp/bmet/hfp/cbc)  You will receive a reminder letter in the mail two months in advance. If you don't receive a letter, please call our office to schedule the follow-up appointment.  

## 2014-06-18 NOTE — Progress Notes (Signed)
Quick Note:  Please report to patient. The recent labs are stable. Continue same medication and careful diet. ______ 

## 2014-07-23 ENCOUNTER — Other Ambulatory Visit: Payer: Self-pay | Admitting: Cardiology

## 2014-07-27 ENCOUNTER — Other Ambulatory Visit: Payer: Self-pay

## 2014-07-27 MED ORDER — SIMVASTATIN 10 MG PO TABS: ORAL_TABLET | ORAL | Status: DC

## 2014-07-27 NOTE — Telephone Encounter (Signed)
Patient needs appointment for more refills.

## 2014-08-09 ENCOUNTER — Other Ambulatory Visit: Payer: Self-pay | Admitting: Cardiology

## 2014-08-24 ENCOUNTER — Other Ambulatory Visit: Payer: Self-pay | Admitting: Cardiology

## 2014-10-01 ENCOUNTER — Other Ambulatory Visit: Payer: Self-pay | Admitting: Cardiology

## 2014-10-02 ENCOUNTER — Other Ambulatory Visit: Payer: Self-pay | Admitting: *Deleted

## 2014-10-02 MED ORDER — FUROSEMIDE 40 MG PO TABS: ORAL_TABLET | ORAL | Status: DC

## 2014-10-22 ENCOUNTER — Ambulatory Visit: Payer: Medicare Other | Admitting: Cardiology

## 2014-10-22 ENCOUNTER — Other Ambulatory Visit: Payer: Medicare Other

## 2014-12-11 ENCOUNTER — Ambulatory Visit (INDEPENDENT_AMBULATORY_CARE_PROVIDER_SITE_OTHER): Payer: Medicare Other | Admitting: Cardiology

## 2014-12-11 ENCOUNTER — Encounter: Payer: Self-pay | Admitting: Cardiology

## 2014-12-11 ENCOUNTER — Other Ambulatory Visit (INDEPENDENT_AMBULATORY_CARE_PROVIDER_SITE_OTHER): Payer: Medicare Other | Admitting: *Deleted

## 2014-12-11 VITALS — BP 104/60 | HR 72 | Ht 68.0 in | Wt 144.8 lb

## 2014-12-11 DIAGNOSIS — R413 Other amnesia: Secondary | ICD-10-CM | POA: Diagnosis not present

## 2014-12-11 DIAGNOSIS — E785 Hyperlipidemia, unspecified: Secondary | ICD-10-CM | POA: Diagnosis not present

## 2014-12-11 DIAGNOSIS — R634 Abnormal weight loss: Secondary | ICD-10-CM | POA: Diagnosis not present

## 2014-12-11 DIAGNOSIS — I5042 Chronic combined systolic (congestive) and diastolic (congestive) heart failure: Secondary | ICD-10-CM | POA: Diagnosis not present

## 2014-12-11 DIAGNOSIS — Z951 Presence of aortocoronary bypass graft: Secondary | ICD-10-CM | POA: Diagnosis not present

## 2014-12-11 LAB — HEPATIC FUNCTION PANEL
ALK PHOS: 62 U/L (ref 39–117)
ALT: 12 U/L (ref 0–53)
AST: 15 U/L (ref 0–37)
Albumin: 3.8 g/dL (ref 3.5–5.2)
BILIRUBIN DIRECT: 0.2 mg/dL (ref 0.0–0.3)
BILIRUBIN TOTAL: 1 mg/dL (ref 0.2–1.2)
TOTAL PROTEIN: 7.1 g/dL (ref 6.0–8.3)

## 2014-12-11 LAB — CBC WITH DIFFERENTIAL/PLATELET
BASOS ABS: 0 10*3/uL (ref 0.0–0.1)
Basophils Relative: 0.4 % (ref 0.0–3.0)
EOS ABS: 0.1 10*3/uL (ref 0.0–0.7)
EOS PCT: 1.4 % (ref 0.0–5.0)
HCT: 40.5 % (ref 39.0–52.0)
Hemoglobin: 13.3 g/dL (ref 13.0–17.0)
LYMPHS ABS: 2.6 10*3/uL (ref 0.7–4.0)
Lymphocytes Relative: 28.1 % (ref 12.0–46.0)
MCHC: 32.8 g/dL (ref 30.0–36.0)
MCV: 98.4 fl (ref 78.0–100.0)
MONOS PCT: 11.6 % (ref 3.0–12.0)
Monocytes Absolute: 1.1 10*3/uL — ABNORMAL HIGH (ref 0.1–1.0)
Neutro Abs: 5.4 10*3/uL (ref 1.4–7.7)
Neutrophils Relative %: 58.5 % (ref 43.0–77.0)
PLATELETS: 170 10*3/uL (ref 150.0–400.0)
RBC: 4.11 Mil/uL — AB (ref 4.22–5.81)
RDW: 16.4 % — ABNORMAL HIGH (ref 11.5–15.5)
WBC: 9.3 10*3/uL (ref 4.0–10.5)

## 2014-12-11 LAB — LIPID PANEL
CHOL/HDL RATIO: 3
Cholesterol: 110 mg/dL (ref 0–200)
HDL: 36.9 mg/dL — AB (ref >39.00)
LDL CALC: 56 mg/dL (ref 0–99)
NonHDL: 73.1
TRIGLYCERIDES: 87 mg/dL (ref 0.0–149.0)
VLDL: 17.4 mg/dL (ref 0.0–40.0)

## 2014-12-11 LAB — BASIC METABOLIC PANEL
BUN: 27 mg/dL — ABNORMAL HIGH (ref 6–23)
CALCIUM: 9.4 mg/dL (ref 8.4–10.5)
CHLORIDE: 103 meq/L (ref 96–112)
CO2: 29 meq/L (ref 19–32)
Creatinine, Ser: 1.46 mg/dL (ref 0.40–1.50)
GFR: 48.46 mL/min — AB (ref >60.00)
Glucose, Bld: 88 mg/dL (ref 70–99)
Potassium: 3.9 mEq/L (ref 3.5–5.1)
SODIUM: 139 meq/L (ref 135–145)

## 2014-12-11 NOTE — Addendum Note (Signed)
Addended by: Tonita PhoenixBOWDEN, Jeanclaude Wentworth K on: 12/11/2014 11:07 AM   Modules accepted: Orders

## 2014-12-11 NOTE — Progress Notes (Signed)
Cardiology Office Note   Date:  12/11/2014   ID:  [name] [name], DOB Jan 22, 1925, MRN [id number]  PCP:  No primary care provider on file.  Cardiologist: Cassell Clement MD  No chief complaint on file.     History of Present Illness: [name] [name] is a 81 y.o. male who presents for a six-month follow-up visit.   He has a history of ischemic heart disease. He has a history of a remote inferior wall myocardial infarction. In 1985 Dr. Andrey Campanile performed coronary artery bypass graft surgery. Patient has a past history of acute on chronic systolic congestive heart failure. His left ventricular ejection fractions have range in the 35-40% range by echocardiogram in 2009. The patient has a prior history of a previous stroke for which he was hospitalized on the neurology service. He has had a history of progressive dementia. Since last visit he has had no new cardiac symptoms . His appetite has decreased and his weight is 2 pounds since last visit. He denies any localizing signs or symptoms. No evidence of GI bleed etc. his dementia remains severe. He no longer has any peripheral edema. The patient gets very little exercise. He does walk to the mailbox. He has not been experiencing any chest pain or angina.. He has not been having any myalgias from his cholesterol medication simvastatin. Blood work today is pending. His dementia appears to be slightly worse despite generic Aricept and Namenda.  Past Medical History  Diagnosis Date  . IHD (ischemic heart disease)   . CHF (congestive heart failure)     SYSTOLIC AND DIASTOLIC CHF  . Dementia     SEVERE  . Dyslipidemia   . PVC's (premature ventricular contractions)   . Bradycardia   . AV block, 1st degree   . Stroke   . Dizziness   . Gout   . Dyspepsia   . MI, old     INFERIOR WALL    Past Surgical History  Procedure Laterality Date  . Vasectomy       Current Outpatient Prescriptions  Medication Sig Dispense Refill  .  aspirin 81 MG tablet Take 81 mg by mouth daily.      . brimonidine (ALPHAGAN P) 0.1 % SOLN Apply to eye 3 (three) times daily.     . Calcium Carbonate-Vitamin D (CALTRATE 600+D PO) Take 1 tablet by mouth daily.     . clopidogrel (PLAVIX) 75 MG tablet Take 75 mg by mouth daily.    Marland Kitchen donepezil (ARICEPT) 10 MG tablet Take 1 tablet (10 mg total) by mouth daily. 90 tablet 3  . furosemide (LASIX) 40 MG tablet Take 40 mg by mouth daily.    Marland Kitchen ibuprofen (ADVIL,MOTRIN) 200 MG tablet Take 200 mg by mouth every 6 (six) hours as needed (pain).     Marland Kitchen latanoprost (XALATAN) 0.005 % ophthalmic solution Place 1 drop into both eyes at bedtime.     . memantine (NAMENDA) 10 MG tablet Take 10 mg by mouth 2 (two) times daily.    . Multiple Vitamin (MULTIVITAMIN) tablet Take 1 tablet by mouth daily.      . nitroGLYCERIN (NITROSTAT) 0.4 MG SL tablet Place 0.4 mg under the tongue every 5 (five) minutes as needed for chest pain (chest pain).    . Potassium Chloride ER 20 MEQ TBCR TAKE 1 TABLET (20 MEQ TOTAL) BY MOUTH DAILY. 30 tablet 5  . simvastatin (ZOCOR) 10 MG tablet Take 10 mg by mouth daily.  No current facility-administered medications for this visit.    Allergies:   Other    Social History:  The patient  reports that he quit smoking about 31 years ago. He has never used smokeless tobacco. He reports that he does not drink alcohol or use illicit drugs.   Family History:  The patient's family history includes Heart disease in his father.    ROS:  Please see the history of present illness.   Otherwise, review of systems are positive for none.   All other systems are reviewed and negative.    PHYSICAL EXAM: VS:  BP 104/60 mmHg  Pulse 72  Ht 5\' 8"  (1.727 m)  Wt 144 lb 12.8 oz (65.681 kg)  BMI 22.02 kg/m2 , BMI Body mass index is 22.02 kg/(m^2). GEN: Well nourished, well developed, in no acute distress HEENT: normal Neck: no JVD, carotid bruits, or masses Cardiac: RRR; no murmurs, rubs, or gallops,no  edema  Respiratory:  clear to auscultation bilaterally, normal work of breathing GI: soft, nontender, nondistended, + BS MS: no deformity or atrophy Skin: warm and dry, no rash Neuro:  Strength and sensation are intact Psych: euthymic mood, full affect   EKG:  EKG is not ordered today.    Recent Labs: 06/17/2014: ALT 16; BUN 15; Creatinine, Ser 1.1; Potassium 4.2; Sodium 142    Lipid Panel    Component Value Date/Time   CHOL 130 06/17/2014 1031   TRIG 101.0 06/17/2014 1031   HDL 38.50* 06/17/2014 1031   CHOLHDL 3 06/17/2014 1031   VLDL 20.2 06/17/2014 1031   LDLCALC 71 06/17/2014 1031      Wt Readings from Last 3 Encounters:  12/11/14 144 lb 12.8 oz (65.681 kg)  06/17/14 146 lb (66.225 kg)  01/26/14 185 lb (83.915 kg)         ASSESSMENT AND PLAN:  1. ischemic heart disease status post old inferior wall myocardial infarction and status post CABG in 1985. 2. chronic systolic heart failure with ejection fraction of 35-40% by echocardiogram 2009 3. history of remote stroke on dual antiplatelet therapy. 4. first degree AV block 5. Hypercholesterolemia 6. Dementia   Current medicines are reviewed at length with the patient today.  The patient does not have concerns regarding medicines.  The following changes have been made:  no change  Labs/ tests ordered today include:  No orders of the defined types were placed in this encounter.     We are checking lab work today.  Recheck in 6 months for office visit EKG lipid panel hepatic function panel and basal metabolic panel.  I told him not to lose any more weight.  Karie SchwalbeSigned, Christoher Drudge MD 12/11/2014 12:36 PM    [facility] [address], [place], [place]  [address] Phone: [phone]; Fax: [phone]

## 2014-12-11 NOTE — Patient Instructions (Addendum)
Medication Instructions:  Your physician recommends that you continue on your current medications as directed. Please refer to the Current Medication list given to you today.  Labwork: Lp/bmet/hfp/cbc  Testing/Procedures: none  Follow-Up: Your physician wants you to follow-up in: 6 months with fasting labs (lp/bmet/hfp) and ekg  You will receive a reminder letter in the mail two months in advance. If you don't receive a letter, please call our office to schedule the follow-up appointment.

## 2014-12-14 NOTE — Progress Notes (Signed)
Quick Note:  Please report to patient. The recent labs are stable. Continue same medication and careful diet. Kidneys are drier. Drink more water. ______ 

## 2015-01-12 ENCOUNTER — Other Ambulatory Visit: Payer: Self-pay

## 2015-01-12 MED ORDER — SIMVASTATIN 10 MG PO TABS: 10.0000 mg | ORAL_TABLET | Freq: Every day | ORAL | Status: AC

## 2015-02-19 ENCOUNTER — Other Ambulatory Visit: Payer: Self-pay | Admitting: Cardiology

## 2015-03-03 ENCOUNTER — Other Ambulatory Visit: Payer: Self-pay | Admitting: Cardiology

## 2015-03-09 ENCOUNTER — Other Ambulatory Visit: Payer: Self-pay | Admitting: *Deleted

## 2015-03-09 MED ORDER — DONEPEZIL HCL 10 MG PO TABS: 10.0000 mg | ORAL_TABLET | Freq: Every day | ORAL | Status: AC

## 2015-04-27 ENCOUNTER — Other Ambulatory Visit: Payer: Self-pay | Admitting: Cardiology

## 2015-04-27 ENCOUNTER — Telehealth: Payer: Self-pay | Admitting: Cardiology

## 2015-04-27 NOTE — Telephone Encounter (Signed)
Pt c/o swelling: STAT is pt has developed SOB within 24 hours  1. How long have you been experiencing swelling? 1 Week  2. Where is the swelling located? Ankles and feet  3.  Are you currently taking a "fluid pill"? No  4.  Are you currently SOB? No  5.  Have you traveled recently? No    Pt's wife calling and wanting to know if an antibiotic can be called in for pt.

## 2015-04-27 NOTE — Telephone Encounter (Signed)
Returned call.  Wife says pts feet are swollen and painful for the last week.  No sob or cp.  Asked about if he's still taking Lasix.  York SpanielSaid they ran out, but still taking potassium.  She hadn't called pharmacy for a refill.  Advised her to call them.

## 2015-04-28 MED ORDER — FUROSEMIDE 40 MG PO TABS: 40.0000 mg | ORAL_TABLET | Freq: Every day | ORAL | Status: AC

## 2015-04-28 NOTE — Telephone Encounter (Signed)
Agree with advice given

## 2015-04-28 NOTE — Telephone Encounter (Signed)
Spoke with patients wife and he has not been taking the Lasix  Advised he needed to resume and watch his salt intake. If no improvement to call back [facility]

## 2015-05-04 ENCOUNTER — Other Ambulatory Visit: Payer: Self-pay | Admitting: Cardiology

## 2015-06-09 ENCOUNTER — Encounter: Payer: Self-pay | Admitting: *Deleted

## 2015-07-15 ENCOUNTER — Ambulatory Visit: Payer: Medicare Other | Admitting: Cardiology

## 2015-09-30 ENCOUNTER — Encounter: Payer: Self-pay | Admitting: Cardiovascular Disease

## 2015-09-30 ENCOUNTER — Ambulatory Visit (INDEPENDENT_AMBULATORY_CARE_PROVIDER_SITE_OTHER): Payer: Medicare Other | Admitting: Cardiovascular Disease

## 2015-09-30 VITALS — BP 106/80 | HR 73 | Ht 68.0 in | Wt 158.4 lb

## 2015-09-30 DIAGNOSIS — E785 Hyperlipidemia, unspecified: Secondary | ICD-10-CM | POA: Diagnosis not present

## 2015-09-30 DIAGNOSIS — I5022 Chronic systolic (congestive) heart failure: Secondary | ICD-10-CM

## 2015-09-30 DIAGNOSIS — I5042 Chronic combined systolic (congestive) and diastolic (congestive) heart failure: Secondary | ICD-10-CM | POA: Diagnosis not present

## 2015-09-30 DIAGNOSIS — I2510 Atherosclerotic heart disease of native coronary artery without angina pectoris: Secondary | ICD-10-CM | POA: Diagnosis not present

## 2015-09-30 DIAGNOSIS — I25119 Atherosclerotic heart disease of native coronary artery with unspecified angina pectoris: Secondary | ICD-10-CM

## 2015-09-30 LAB — COMPREHENSIVE METABOLIC PANEL
ALT: 20 U/L (ref 9–46)
AST: 25 U/L (ref 10–35)
Albumin: 3.2 g/dL — ABNORMAL LOW (ref 3.6–5.1)
Alkaline Phosphatase: 84 U/L (ref 40–115)
BILIRUBIN TOTAL: 1.6 mg/dL — AB (ref 0.2–1.2)
BUN: 34 mg/dL — AB (ref 7–25)
CO2: 21 mmol/L (ref 20–31)
CREATININE: 1.26 mg/dL — AB (ref 0.70–1.11)
Calcium: 9 mg/dL (ref 8.6–10.3)
Chloride: 108 mmol/L (ref 98–110)
GLUCOSE: 81 mg/dL (ref 65–99)
Potassium: 5.2 mmol/L (ref 3.5–5.3)
SODIUM: 139 mmol/L (ref 135–146)
Total Protein: 6.2 g/dL (ref 6.1–8.1)

## 2015-09-30 LAB — LIPID PANEL
CHOL/HDL RATIO: 3 ratio (ref <=5.0)
Cholesterol: 109 mg/dL — ABNORMAL LOW (ref 125–200)
HDL: 36 mg/dL — ABNORMAL LOW (ref >=40)
LDL Cholesterol: 62 mg/dL (ref <130)
Triglycerides: 56 mg/dL (ref <150)
VLDL: 11 mg/dL (ref <30)

## 2015-09-30 NOTE — Patient Instructions (Signed)

## 2015-09-30 NOTE — Progress Notes (Signed)
Cardiology Office Note   Date:  09/30/2015   ID:  [name] [name], DOB 1925/06/02, MRN [id number]  PCP:  No primary care provider on file.  Cardiologist: Cassell Clement MD  Chief Complaint  Patient presents with  . Coronary Artery Disease   Problem List 1. CAD 2. Chronic systolic CHF 3. CVA    History of Present Illness: [name] [name] is a 82 y.o. male who presents for a six-month follow-up visit.   He has a history of ischemic heart disease. He has a history of a remote inferior wall myocardial infarction. In 1985 Dr. Andrey Campanile performed coronary artery bypass graft surgery. Patient has a past history of acute on chronic systolic congestive heart failure. His left ventricular ejection fractions have range in the 35-40% range by echocardiogram in 2009. The patient has a prior history of a previous stroke for which he was hospitalized on the neurology service. He has had a history of progressive dementia. Since last visit he has had no new cardiac symptoms . His appetite has decreased and his weight is 2 pounds since last visit. He denies any localizing signs or symptoms. No evidence of GI bleed etc. his dementia remains severe. He no longer has any peripheral edema. The patient gets very little exercise. He does walk to the mailbox. He has not been experiencing any chest pain or angina.. He has not been having any myalgias from his cholesterol medication simvastatin. Blood work today is pending. His dementia appears to be slightly worse despite generic Aricept and Namenda.  September 30, 2015:  Doing ok  ( wife answers almost every question )  Has had leg edema for a few days.  Is not short of breath C/p dry mouth.   Has a dry mouth  No CP    Past Medical History  Diagnosis Date  . IHD (ischemic heart disease)   . CHF (congestive heart failure) (HCC)     SYSTOLIC AND DIASTOLIC CHF  . Dementia     SEVERE  . Dyslipidemia   . PVC's (premature ventricular contractions)     . Bradycardia   . AV block, 1st degree   . Stroke (HCC)   . Dizziness   . Gout   . Dyspepsia   . MI, old     INFERIOR WALL    Past Surgical History  Procedure Laterality Date  . Vasectomy       Current Outpatient Prescriptions  Medication Sig Dispense Refill  . aspirin 81 MG tablet Take 81 mg by mouth daily.      . brimonidine (ALPHAGAN P) 0.1 % SOLN Apply to eye 3 (three) times daily.     . Calcium Carbonate-Vitamin D (CALTRATE 600+D PO) Take 1 tablet by mouth daily.     . clopidogrel (PLAVIX) 75 MG tablet Take 75 mg by mouth daily.    . clopidogrel (PLAVIX) 75 MG tablet TAKE 1 TABLET DAILY 90 tablet 3  . donepezil (ARICEPT) 10 MG tablet Take 1 tablet (10 mg total) by mouth daily. 90 tablet 3  . furosemide (LASIX) 40 MG tablet Take 1 tablet (40 mg total) by mouth daily. 30 tablet 5  . ibuprofen (ADVIL,MOTRIN) 200 MG tablet Take 200 mg by mouth every 6 (six) hours as needed (pain).     Marland Kitchen latanoprost (XALATAN) 0.005 % ophthalmic solution Place 1 drop into both eyes at bedtime.     . memantine (NAMENDA) 10 MG tablet TAKE 1 TABLET TWICE A DAY 180 tablet 3  .  Multiple Vitamin (MULTIVITAMIN) tablet Take 1 tablet by mouth daily.      . nitroGLYCERIN (NITROSTAT) 0.4 MG SL tablet Place 0.4 mg under the tongue every 5 (five) minutes as needed for chest pain (chest pain).    . Potassium Chloride ER 20 MEQ TBCR TAKE 1 TABLET (20 MEQ TOTAL) BY MOUTH DAILY. 30 tablet 3  . simvastatin (ZOCOR) 10 MG tablet Take 1 tablet (10 mg total) by mouth daily. 90 tablet 1   No current facility-administered medications for this visit.    Allergies:   Other    Social History:  The patient  reports that he quit smoking about 32 years ago. He has never used smokeless tobacco. He reports that he does not drink alcohol or use illicit drugs.   Family History:  The patient's family history includes Heart disease in his father.    ROS:  Please see the history of present illness.   Otherwise, review of  systems are positive for none.   All other systems are reviewed and negative.    PHYSICAL EXAM: VS:  BP 106/80 mmHg  Pulse 73  Ht 5\' 8"  (1.727 m)  Wt 158 lb 6.4 oz (71.85 kg)  BMI 24.09 kg/m2 , BMI Body mass index is 24.09 kg/(m^2). GEN: Well nourished, well developed, in no acute distress HEENT: normal Neck: no JVD, carotid bruits, or masses Cardiac: RRR; no murmurs, rubs, or gallops,no edema  Respiratory:  clear to auscultation bilaterally, normal work of breathing GI: soft, nontender, nondistended, + BS MS: no deformity or atrophy Skin: warm and dry, no rash Neuro:  Strength and sensation are intact Psych: euthymic mood, full affect   EKG:  EKG is ordered today. NSR with 1st degree AV block . Marland Kitchen.   NS IVCD . HR 73.     Recent Labs: 12/11/2014: ALT 12; BUN 27*; Creatinine, Ser 1.46; Hemoglobin 13.3; Platelets 170.0; Potassium 3.9; Sodium 139    Lipid Panel    Component Value Date/Time   CHOL 110 12/11/2014 1107   TRIG 87.0 12/11/2014 1107   HDL 36.90* 12/11/2014 1107   CHOLHDL 3 12/11/2014 1107   VLDL 17.4 12/11/2014 1107   LDLCALC 56 12/11/2014 1107      Wt Readings from Last 3 Encounters:  09/30/15 158 lb 6.4 oz (71.85 kg)  12/11/14 144 lb 12.8 oz (65.681 kg)  06/17/14 146 lb (66.225 kg)         ASSESSMENT AND PLAN:  1. ischemic heart disease status post old inferior wall myocardial infarction and status post CABG in 1985. No angina   2. chronic systolic heart failure with ejection fraction of 35-40% by echocardiogram 2009 Continue meds , BP is on the low side.   3. history of remote stroke on dual antiplatelet therapy. 4. first degree AV block 5. Hypercholesterolemia- will check labs today   6. Dementia - he is severely limited.   Wife answers most of his questions .   Current medicines are reviewed at length with the patient today.  The patient does not have concerns regarding medicines.  The following changes have been made:  no change  Labs/  tests ordered today include:  No orders of the defined types were placed in this encounter.    We are checking lab work today.  Recheck in 1 year. I have given him names of geriatric doctors in the area.  He needs to get a primary  doctor

## 2015-12-11 DEATH — deceased

## 2019-10-20 ENCOUNTER — Encounter: Payer: Self-pay | Admitting: Cardiovascular Disease
